# Patient Record
Sex: Female | Born: 1965 | Race: White | Hispanic: No | Smoking: Current every day smoker
Health system: Southern US, Community
[De-identification: ages and names within clinical notes are randomized; demographics above are authoritative.]

## PROBLEM LIST (undated history)

## (undated) DIAGNOSIS — M1990 Unspecified osteoarthritis, unspecified site: Secondary | ICD-10-CM

## (undated) DIAGNOSIS — I2510 Atherosclerotic heart disease of native coronary artery without angina pectoris: Secondary | ICD-10-CM

## (undated) DIAGNOSIS — R519 Headache, unspecified: Secondary | ICD-10-CM

## (undated) DIAGNOSIS — O00109 Unspecified tubal pregnancy without intrauterine pregnancy: Secondary | ICD-10-CM

## (undated) DIAGNOSIS — Z8669 Personal history of other diseases of the nervous system and sense organs: Secondary | ICD-10-CM

## (undated) DIAGNOSIS — G4733 Obstructive sleep apnea (adult) (pediatric): Secondary | ICD-10-CM

## (undated) DIAGNOSIS — C539 Malignant neoplasm of cervix uteri, unspecified: Secondary | ICD-10-CM

## (undated) DIAGNOSIS — J449 Chronic obstructive pulmonary disease, unspecified: Secondary | ICD-10-CM

## (undated) DIAGNOSIS — K219 Gastro-esophageal reflux disease without esophagitis: Secondary | ICD-10-CM

## (undated) HISTORY — DX: Malignant neoplasm of cervix uteri, unspecified: C53.9

## (undated) HISTORY — DX: Obstructive sleep apnea (adult) (pediatric): G47.33

## (undated) HISTORY — DX: Gastro-esophageal reflux disease without esophagitis: K21.9

## (undated) HISTORY — DX: Personal history of other diseases of the nervous system and sense organs: Z86.69

## (undated) HISTORY — DX: Unspecified tubal pregnancy without intrauterine pregnancy: O00.109

## (undated) HISTORY — DX: Obstructive sleep apnea (adult) (pediatric): J44.9

---

## 1985-07-08 HISTORY — PX: CHOLECYSTECTOMY: SHX55

## 1990-07-08 HISTORY — PX: TUBAL LIGATION: SHX77

## 2003-07-09 DIAGNOSIS — C539 Malignant neoplasm of cervix uteri, unspecified: Secondary | ICD-10-CM

## 2003-07-09 HISTORY — PX: ABDOMINAL HYSTERECTOMY: SHX81

## 2003-07-09 HISTORY — DX: Malignant neoplasm of cervix uteri, unspecified: C53.9

## 2003-07-09 HISTORY — PX: ACHILLES TENDON REPAIR: SUR1153

## 2003-10-18 ENCOUNTER — Ambulatory Visit (HOSPITAL_BASED_OUTPATIENT_CLINIC_OR_DEPARTMENT_OTHER): Admission: RE | Admit: 2003-10-18 | Discharge: 2003-10-18 | Payer: Self-pay | Admitting: Orthopaedic Surgery

## 2004-04-04 ENCOUNTER — Other Ambulatory Visit: Admission: RE | Admit: 2004-04-04 | Discharge: 2004-04-04 | Payer: Self-pay | Admitting: Obstetrics and Gynecology

## 2004-04-16 ENCOUNTER — Ambulatory Visit (HOSPITAL_COMMUNITY): Admission: RE | Admit: 2004-04-16 | Discharge: 2004-04-16 | Payer: Self-pay | Admitting: Obstetrics and Gynecology

## 2004-04-26 ENCOUNTER — Inpatient Hospital Stay (HOSPITAL_COMMUNITY): Admission: RE | Admit: 2004-04-26 | Discharge: 2004-04-28 | Payer: Self-pay | Admitting: Obstetrics and Gynecology

## 2005-04-08 ENCOUNTER — Other Ambulatory Visit: Admission: RE | Admit: 2005-04-08 | Discharge: 2005-04-08 | Payer: Self-pay | Admitting: Obstetrics and Gynecology

## 2011-10-03 ENCOUNTER — Ambulatory Visit: Payer: Commercial Managed Care - PPO

## 2013-09-22 ENCOUNTER — Ambulatory Visit (INDEPENDENT_AMBULATORY_CARE_PROVIDER_SITE_OTHER): Payer: BC Managed Care – PPO | Admitting: Internal Medicine

## 2013-09-22 ENCOUNTER — Telehealth: Payer: Self-pay | Admitting: Internal Medicine

## 2013-09-22 ENCOUNTER — Encounter: Payer: Self-pay | Admitting: Internal Medicine

## 2013-09-22 VITALS — BP 108/72 | HR 77 | Temp 98.3°F | Ht 68.25 in | Wt 248.5 lb

## 2013-09-22 DIAGNOSIS — N951 Menopausal and female climacteric states: Secondary | ICD-10-CM

## 2013-09-22 DIAGNOSIS — G479 Sleep disorder, unspecified: Secondary | ICD-10-CM

## 2013-09-22 DIAGNOSIS — K117 Disturbances of salivary secretion: Secondary | ICD-10-CM

## 2013-09-22 DIAGNOSIS — F17200 Nicotine dependence, unspecified, uncomplicated: Secondary | ICD-10-CM

## 2013-09-22 DIAGNOSIS — E669 Obesity, unspecified: Secondary | ICD-10-CM

## 2013-09-22 DIAGNOSIS — K219 Gastro-esophageal reflux disease without esophagitis: Secondary | ICD-10-CM

## 2013-09-22 DIAGNOSIS — R682 Dry mouth, unspecified: Secondary | ICD-10-CM

## 2013-09-22 DIAGNOSIS — G4733 Obstructive sleep apnea (adult) (pediatric): Secondary | ICD-10-CM | POA: Insufficient documentation

## 2013-09-22 NOTE — Assessment & Plan Note (Signed)
Increase your water intake Cut back on the smoking Use moisturizing mouth rinse such as biotene daily

## 2013-09-22 NOTE — Assessment & Plan Note (Signed)
Try to avoid any dairy after 7 pm Start taking your Nexium at night to see if this helps OK to take Tums if needed in addition

## 2013-09-22 NOTE — Assessment & Plan Note (Signed)
Will refer to pulm for evaluation for poss sleep study before starting sleep aid

## 2013-09-22 NOTE — Progress Notes (Signed)
HPI  Pt presents to the clinic today to establish care. She has not had a PCP in many years. She does have multiple concerns today.  1- She is having difficulty sleeping at night. She is concerned that she may have sleep apnea. She reports that she wakes herself up snoring and when she wakes up she often has trouble catching her breath. 2- Dry mouth. She is a smoker and does not drink any water 3- GERD: She is on nexium. She feels like her heartburn is acting up 2-3 times per week. She notices it more when she drinks a glass of mild before bed. 4: Hot flashes and night sweats. Started about a year and a half ago. Worse at night. She is on estroven which does help somewhat. 5. She is a current everyday smoker. She has some nicotine patches on hand, however she currently is not ready to quit. Her husband passed away 3 months ago from lung cancer. This has caused her a lot of grief.   Flu: never Tetanus:> 10 years ago LMP: Hysterectomy Pap Smear: 2006 Mammogram: 2005 Eye Doctor: never Dentist: as needed, last 2013    Past Medical History  Diagnosis Date  . Tubal pregnancy   . GERD (gastroesophageal reflux disease)   . Cervical cancer 2005  . Hx of migraines     Current Outpatient Prescriptions  Medication Sig Dispense Refill  . b complex vitamins capsule Take 1 capsule by mouth daily.      . calcium carbonate (TUMS - DOSED IN MG ELEMENTAL CALCIUM) 500 MG chewable tablet Chew 1 tablet by mouth daily.      Marland Kitchen esomeprazole (NEXIUM) 20 MG capsule Take 20 mg by mouth daily at 12 noon.      Marland Kitchen ibuprofen (ADVIL,MOTRIN) 200 MG tablet Take 600 mg by mouth as needed.      . Multiple Vitamins-Calcium (ONE-A-DAY WOMENS FORMULA PO) Take 1 capsule by mouth daily.      . Nutritional Supplements (ESTROVEN PO) Take 1 capsule by mouth daily.       No current facility-administered medications for this visit.    No Known Allergies  Family History  Problem Relation Age of Onset  . Heart disease  Mother   . Heart disease Father   . Hypertension Father     History   Social History  . Marital Status: Widowed    Spouse Name: N/A    Number of Children: N/A  . Years of Education: N/A   Occupational History  . Not on file.   Social History Main Topics  . Smoking status: Current Every Day Smoker -- 0.33 packs/day    Types: Cigarettes  . Smokeless tobacco: Never Used  . Alcohol Use: Yes     Comment: occasional  . Drug Use: No  . Sexual Activity: Not Currently   Other Topics Concern  . Not on file   Social History Narrative  . No narrative on file    ROS:  Constitutional: Denies fever, malaise, fatigue, headache or abrupt weight changes.  Respiratory: Denies difficulty breathing, shortness of breath, cough or sputum production.   Cardiovascular: Denies chest pain, chest tightness, palpitations or swelling in the hands or feet.    No other specific complaints in a complete review of systems (except as listed in HPI above).  PE:  BP 108/72  Pulse 77  Temp(Src) 98.3 F (36.8 C) (Oral)  Ht 5' 8.25" (1.734 m)  Wt 248 lb 8 oz (112.719 kg)  BMI 37.49  kg/m2  SpO2 98% Wt Readings from Last 3 Encounters:  09/22/13 248 lb 8 oz (112.719 kg)    General: Appears her stated age, obese but well developed, well nourished in NAD. Cardiovascular: Normal rate and rhythm. S1,S2 noted.  No murmur, rubs or gallops noted. No JVD or BLE edema. No carotid bruits noted. Pulmonary/Chest: Normal effort and positive vesicular breath sounds. No respiratory distress. No wheezes, rales or ronchi noted.   Assessment and Plan:

## 2013-09-22 NOTE — Assessment & Plan Note (Signed)
Fair control with estroven Continue at this time

## 2013-09-22 NOTE — Progress Notes (Signed)
HPI  Pt presents to the office today to establish care. She has not had a PCP in many years. She is a current everyday smoker. She has some nicotine patches on hand, however she currently is not ready to quit. Her husband passed away 3 months ago from lung cancer. This has caused her a lot of grief.   She does have some concerns today. She is having trouble sleeping. She states she does not feel rested, often has issues staying asleep. She does snore and was told by her late husband she snores loudly and did hold her breath at times. She denies difficulty breathing, syncopal episodes, or other sleep disturbances.  She also addresses concerns with her GERD. She takes Nexium 20mg  in the am. However at night she has heartburn that often keeps her awake. She does admit to eating and drinking close to bedtime. She denies nausea or vomiting.  Other concerns include hot flashes and night sweats. She does take estroven supplemental during the day. It has seemed to help, but at night the hot flashes and night sweats are worse. She has not tried anything else at night.   Pap smear: 2006; partial hysterectomy LMP: n/a Mammogram: 2005 Tetanus: >10 years Flu vaccine: n/a Dental Exam: 2013 Eye Exam: never; need to go  Past Medical History  Diagnosis Date  . Tubal pregnancy   . GERD (gastroesophageal reflux disease)   . Cervical cancer 2005  . Hx of migraines    No Known Allergies  Current outpatient prescriptions:b complex vitamins capsule, Take 1 capsule by mouth daily., Disp: , Rfl: ;  calcium carbonate (TUMS - DOSED IN MG ELEMENTAL CALCIUM) 500 MG chewable tablet, Chew 1 tablet by mouth daily., Disp: , Rfl: ;  esomeprazole (NEXIUM) 20 MG capsule, Take 20 mg by mouth daily at 12 noon., Disp: , Rfl: ;  ibuprofen (ADVIL,MOTRIN) 200 MG tablet, Take 600 mg by mouth as needed., Disp: , Rfl:  Multiple Vitamins-Calcium (ONE-A-DAY WOMENS FORMULA PO), Take 1 capsule by mouth daily., Disp: , Rfl: ;   Nutritional Supplements (ESTROVEN PO), Take 1 capsule by mouth daily., Disp: , Rfl:    No family history on file.  History   Social History  . Marital Status: Widowed    Spouse Name: N/A    Number of Children: N/A  . Years of Education: N/A   Occupational History  . Not on file.   Social History Main Topics  . Smoking status: Not on file  . Smokeless tobacco: Not on file  . Alcohol Use: Not on file  . Drug Use: Not on file  . Sexual Activity: Not on file   Other Topics Concern  . Not on file   Social History Narrative  . No narrative on file    ROS:  Constitutional: Denies fever, malaise, fatigue, headache or abrupt weight changes.  Respiratory: Denies difficulty breathing, shortness of breath, cough or sputum production.   Cardiovascular: Denies chest pain, chest tightness, palpitations or swelling in the hands or feet.  Gastrointestinal: Denies abdominal pain, bloating, constipation, diarrhea or blood in the stool.    No other specific complaints in a complete review of systems (except as listed in HPI above).  PE:  There were no vitals taken for this visit. Wt Readings from Last 3 Encounters:  No data found for Wt    General: Appears their stated age, well developed, well nourished in NAD.Marland Kitchen  Cardiovascular: Normal rate and rhythm. S1,S2 noted.  No murmur, rubs or gallops  noted. No JVD or BLE edema. No carotid bruits noted. Pulmonary/Chest: Normal effort and positive vesicular breath sounds. No respiratory distress. No wheezes, rales or ronchi noted.  Abdomen: Soft and nontender. Normal bowel sounds, no bruits noted. No distention or masses noted. Liver, spleen and kidneys non palpable. Psychiatric: Mood and affect normal. Behavior is normal. Judgment and thought content normal.   EKG:  BMET No results found for this basename: na, k, cl, co2, glucose, bun, creatinine, calcium, gfrnonaa, gfraa    Lipid Panel  No results found for this basename: chol, trig,  hdl, cholhdl, vldl, ldlcalc    CBC No results found for this basename: wbc, rbc, hgb, hct, plt, mcv, mch, mchc, rdw, neutrabs, lymphsabs, monoabs, eosabs, basosabs    Hgb A1C No results found for this basename: HGBA1C     Assessment and Plan: Health Maintenance: Tdap to be given at follow up Will refer for mammogram Will refer for sleep study for possible sleep apnea; pt seems very concerned with this. Obtained cmet, cbc, and lipid panel today  Sleep disturbances: Will obtain a sleep study and go from here; do not feel comfortable with sleep agent at this point  GERD: Can try to decrease eating close to bedtimes; limit caffiene and irritating foods Try taking Nexium in the evening to see if this improves Will readdress at later visit  Hot flashes/night sweats: Try to taking estroven at night to see if helps Will readdress again if no improvement  Follow up in one month for physical  Eura Radabaugh, Demetrius Charity, Student-NP

## 2013-09-22 NOTE — Patient Instructions (Addendum)
Menopause Menopause is the normal time of life when menstrual periods stop completely. Menopause is complete when you have missed 12 consecutive menstrual periods. It usually occurs between the ages of 8 years and 48 years. Very rarely does a woman develop menopause before the age of 60 years. At menopause, your ovaries stop producing the female hormones estrogen and progesterone. This can cause undesirable symptoms and also affect your health. Sometimes the symptoms may occur 4 5 years before the menopause begins. There is no relationship between menopause and:  Oral contraceptives.  Number of children you had.  Race.  The age your menstrual periods started (menarche). Heavy smokers and very thin women may develop menopause earlier in life. CAUSES  The ovaries stop producing the female hormones estrogen and progesterone.  Other causes include:  Surgery to remove both ovaries.  The ovaries stop functioning for no known reason.  Tumors of the pituitary gland in the brain.  Medical disease that affects the ovaries and hormone production.  Radiation treatment to the abdomen or pelvis.  Chemotherapy that affects the ovaries. SYMPTOMS   Hot flashes.  Night sweats.  Decrease in sex drive.  Vaginal dryness and thinning of the vagina causing painful intercourse.  Dryness of the skin and developing wrinkles.  Headaches.  Tiredness.  Irritability.  Memory problems.  Weight gain.  Bladder infections.  Hair growth of the face and chest.  Infertility. More serious symptoms include:  Loss of bone (osteoporosis) causing breaks (fractures).  Depression.  Hardening and narrowing of the arteries (atherosclerosis) causing heart attacks and strokes. DIAGNOSIS   When the menstrual periods have stopped for 12 straight months.  Physical exam.  Hormone studies of the blood. TREATMENT  There are many treatment choices and nearly as many questions about them. The  decisions to treat or not to treat menopausal changes is an individual choice made with your health care provider. Your health care provider can discuss the treatments with you. Together, you can decide which treatment will work best for you. Your treatment choices may include:   Hormone therapy (estrogen and progesterone).  Non-hormonal medicines.  Treating the individual symptoms with medicine (for example antidepressants for depression).  Herbal medicines that may help specific symptoms.  Counseling by a psychiatrist or psychologist.  Group therapy.  Lifestyle changes including:  Eating healthy.  Regular exercise.  Limiting caffeine and alcohol.  Stress management and meditation.  No treatment. HOME CARE INSTRUCTIONS   Take the medicine your health care provider gives you as directed.  Get plenty of sleep and rest.  Exercise regularly.  Eat a diet that contains calcium (good for the bones) and soy products (acts like estrogen hormone).  Avoid alcoholic beverages.  Do not smoke.  If you have hot flashes, dress in layers.  Take supplements, calcium, and vitamin D to strengthen bones.  You can use over-the-counter lubricants or moisturizers for vaginal dryness.  Group therapy is sometimes very helpful.  Acupuncture may be helpful in some cases. SEEK MEDICAL CARE IF:   You are not sure you are in menopause.  You are having menopausal symptoms and need advice and treatment.  You are still having menstrual periods after age 71 years.  You have pain with intercourse.  Menopause is complete (no menstrual period for 12 months) and you develop vaginal bleeding.  You need a referral to a specialist (gynecologist, psychiatrist, or psychologist) for treatment. SEEK IMMEDIATE MEDICAL CARE IF:   You have severe depression.  You have excessive vaginal bleeding.  You fell and think you have a broken bone.  You have pain when you urinate.  You develop leg or  chest pain.  You have a fast pounding heart beat (palpitations).  You have severe headaches.  You develop vision problems.  You feel a lump in your breast.  You have abdominal pain or severe indigestion. Document Released: 09/14/2003 Document Revised: 02/24/2013 Document Reviewed: 01/21/2013 [facility] Patient Information 2014 [place], [place].

## 2013-09-22 NOTE — Telephone Encounter (Signed)
Relevant patient education assigned to patient using Emmi. ° °

## 2013-09-22 NOTE — Progress Notes (Signed)
Pre visit review using our clinic review tool, if applicable. No additional management support is needed unless otherwise documented below in the visit note. 

## 2013-09-22 NOTE — Assessment & Plan Note (Signed)
Encouraged her to work on diet and exercise 

## 2013-09-22 NOTE — Assessment & Plan Note (Signed)
Unmotivated to quit at this time

## 2013-09-23 LAB — LIPID PANEL
CHOLESTEROL: 190 mg/dL (ref 0–200)
HDL: 53 mg/dL (ref >39.00)
LDL Cholesterol: 116 mg/dL — ABNORMAL HIGH (ref 0–99)
Total CHOL/HDL Ratio: 4
Triglycerides: 105 mg/dL (ref 0.0–149.0)
VLDL: 21 mg/dL (ref 0.0–40.0)

## 2013-09-23 LAB — COMPREHENSIVE METABOLIC PANEL
ALBUMIN: 4.1 g/dL (ref 3.5–5.2)
ALT: 22 U/L (ref 0–35)
AST: 19 U/L (ref 0–37)
Alkaline Phosphatase: 58 U/L (ref 39–117)
BILIRUBIN TOTAL: 0.3 mg/dL (ref 0.3–1.2)
BUN: 15 mg/dL (ref 6–23)
CO2: 27 meq/L (ref 19–32)
Calcium: 9 mg/dL (ref 8.4–10.5)
Chloride: 105 mEq/L (ref 96–112)
Creatinine, Ser: 0.8 mg/dL (ref 0.4–1.2)
GFR: 80.95 mL/min (ref >60.00)
GLUCOSE: 91 mg/dL (ref 70–99)
Potassium: 4 mEq/L (ref 3.5–5.1)
SODIUM: 141 meq/L (ref 135–145)
Total Protein: 6.7 g/dL (ref 6.0–8.3)

## 2013-09-23 LAB — TSH: TSH: 0.93 u[IU]/mL (ref 0.35–5.50)

## 2013-09-23 LAB — HEMOGLOBIN A1C: HEMOGLOBIN A1C: 6.1 % (ref 4.6–6.5)

## 2013-09-23 LAB — CBC
HCT: 40.9 % (ref 36.0–46.0)
Hemoglobin: 13.5 g/dL (ref 12.0–15.0)
MCHC: 33 g/dL (ref 30.0–36.0)
MCV: 93.2 fl (ref 78.0–100.0)
Platelets: 212 10*3/uL (ref 150.0–400.0)
RBC: 4.39 Mil/uL (ref 3.87–5.11)
RDW: 14 % (ref 11.5–14.6)
WBC: 9.6 10*3/uL (ref 4.5–10.5)

## 2013-10-07 ENCOUNTER — Encounter: Payer: Self-pay | Admitting: Internal Medicine

## 2013-10-07 ENCOUNTER — Ambulatory Visit: Payer: BC Managed Care – PPO | Admitting: Internal Medicine

## 2013-10-07 ENCOUNTER — Ambulatory Visit (INDEPENDENT_AMBULATORY_CARE_PROVIDER_SITE_OTHER): Payer: BC Managed Care – PPO | Admitting: Internal Medicine

## 2013-10-07 VITALS — BP 114/72 | HR 72 | Temp 97.8°F | Ht 68.25 in | Wt 248.0 lb

## 2013-10-07 DIAGNOSIS — Z1239 Encounter for other screening for malignant neoplasm of breast: Secondary | ICD-10-CM

## 2013-10-07 DIAGNOSIS — Z23 Encounter for immunization: Secondary | ICD-10-CM

## 2013-10-07 DIAGNOSIS — Z0000 Encounter for general adult medical examination without abnormal findings: Secondary | ICD-10-CM

## 2013-10-07 NOTE — Patient Instructions (Addendum)

## 2013-10-07 NOTE — Progress Notes (Signed)
Subjective:    Patient ID: Kathryn Mason, female    DOB: 1966-02-29, 48 y.o.   MRN: [id number]  HPI  Pt presents to the clinic today for her annual exam. She has no concerns today.  Pap smear: 2006; partial hysterectomy LMP: n/a Mammogram: 2005 Tetanus: >10 years Flu vaccine: n/a Dental Exam: 2013 Eye Exam: never; need to go  Review of Systems      Past Medical History  Diagnosis Date  . Tubal pregnancy   . GERD (gastroesophageal reflux disease)   . Cervical cancer 2005  . Hx of migraines     Current Outpatient Prescriptions  Medication Sig Dispense Refill  . b complex vitamins capsule Take 1 capsule by mouth daily.      . Black Cohosh 40 MG CAPS Take 2 capsules by mouth at bedtime and may repeat dose one time if needed.      . calcium carbonate (TUMS - DOSED IN MG ELEMENTAL CALCIUM) 500 MG chewable tablet Chew 1 tablet by mouth daily.      Marland Kitchen esomeprazole (NEXIUM) 20 MG capsule Take 20 mg by mouth daily at 12 noon.      Marland Kitchen ibuprofen (ADVIL,MOTRIN) 200 MG tablet Take 600 mg by mouth as needed.      . Melatonin 5 MG CAPS Take 1 capsule by mouth at bedtime and may repeat dose one time if needed.      . Multiple Vitamins-Calcium (ONE-A-DAY WOMENS FORMULA PO) Take 1 capsule by mouth daily.      . Nutritional Supplements (ESTROVEN PO) Take 1 capsule by mouth daily.       No current facility-administered medications for this visit.    No Known Allergies  Family History  Problem Relation Age of Onset  . Heart disease Mother   . Heart disease Father   . Hypertension Father     History   Social History  . Marital Status: Widowed    Spouse Name: N/A    Number of Children: N/A  . Years of Education: N/A   Occupational History  . Not on file.   Social History Main Topics  . Smoking status: Current Every Day Smoker -- 0.33 packs/day    Types: Cigarettes  . Smokeless tobacco: Never Used  . Alcohol Use: Yes     Comment: occasional  . Drug Use: No  . Sexual  Activity: Not Currently   Other Topics Concern  . Not on file   Social History Narrative  . No narrative on file     Constitutional: Denies fever, malaise, fatigue, headache or abrupt weight changes.  HEENT: Denies eye pain, eye redness, ear pain, ringing in the ears, wax buildup, runny nose, nasal congestion, bloody nose, or sore throat. Respiratory: Denies difficulty breathing, shortness of breath, cough or sputum production.   Cardiovascular: Denies chest pain, chest tightness, palpitations or swelling in the hands or feet.  Gastrointestinal: Denies abdominal pain, bloating, constipation, diarrhea or blood in the stool.  GU: Pt reports some stress incontinence.Denies urgency, frequency, pain with urination, burning sensation, blood in urine, odor or discharge. Musculoskeletal: Denies decrease in range of motion, difficulty with gait, muscle pain or joint pain and swelling.  Skin: Denies redness, rashes, lesions or ulcercations.  Neurological: Denies dizziness, difficulty with memory, difficulty with speech or problems with balance and coordination.   No other specific complaints in a complete review of systems (except as listed in HPI above).  Objective:   Physical Exam   BP 114/72  Pulse 72  Temp(Src) 97.8 F (36.6 C) (Oral)  Ht 5' 8.25" (1.734 m)  Wt 248 lb (112.492 kg)  BMI 37.41 kg/m2  SpO2 98% Wt Readings from Last 3 Encounters:  10/07/13 248 lb (112.492 kg)  09/22/13 248 lb 8 oz (112.719 kg)    Constitutional:  Alert, oriented x 4, well developed, well nourished in no apparent distress. Skin: Skin is warm and dry.  No erythema, lesion or ulceration noted. HEENT: Head: normal shape and size; Eyes: sclera white, no icterus, conjunctiva pink, PERRLA and EOMs intact; Ears: Tm's gray and intact, normal light reflex; Nose: mucosa pink and moist, septum midline; Throat/Mouth: Teeth present, , mucosa pink and moist, no lesions or ulcerations noted. Neck: Normal range of  motion. Neck supple, trachea midline. No massses, lumps or thyromegaly present.  Cardiovascular: Normal rate and rhythm. S1,S2 noted.  No murmur, rubs or gallops noted. No JVD or BLE edema. No carotid bruits noted. Pulmonary/Chest: Normal effort and positive vesicular breath sounds. No respiratory distress. No wheezes, rales or ronchi noted.  Abdomen: Soft and nontender. Normal bowel sounds, no bruits noted. No distention or masses noted. Liver, spleen and kidneys non palpable. Genitourinary: Normal female anatomy.  Adenexa non palpable. Breast without lumps or masses.  Musculoskeletal: Normal range of motion. Patient exhibits no effusions.  Neurological: Alert and oriented. Cranial nerves II-XII intact. Coordination normal. +DTRs bilaterally. Psychiatric: She has a normal mood and affect. Behavior is normal. Judgment and thought content normal.    BMET    Component Value Date/Time   NA 141 09/22/2013 1613   K 4.0 09/22/2013 1613   CL 105 09/22/2013 1613   CO2 27 09/22/2013 1613   GLUCOSE 91 09/22/2013 1613   BUN 15 09/22/2013 1613   CREATININE 0.8 09/22/2013 1613   CALCIUM 9.0 09/22/2013 1613    Lipid Panel     Component Value Date/Time   CHOL 190 09/22/2013 1613   TRIG 105.0 09/22/2013 1613   HDL 53.00 09/22/2013 1613   CHOLHDL 4 09/22/2013 1613   VLDL 21.0 09/22/2013 1613   LDLCALC 116* 09/22/2013 1613    CBC    Component Value Date/Time   WBC 9.6 09/22/2013 1613   RBC 4.39 09/22/2013 1613   HGB 13.5 09/22/2013 1613   HCT 40.9 09/22/2013 1613   PLT 212.0 09/22/2013 1613   MCV 93.2 09/22/2013 1613   MCHC 33.0 09/22/2013 1613   RDW 14.0 09/22/2013 1613    Hgb A1C Lab Results  Component Value Date   HGBA1C 6.1 09/22/2013        Assessment & Plan:   Preventative Health Maintenance:  Breast exam today Pelvic exam today Smoking Cessation discussed, she is not ready to quit Mammogram ordered Encouraged her to visit an eye doctor and dentist Tdap today Lab work reviewed  RTC in  1 year or sooner if needed

## 2013-10-11 NOTE — Addendum Note (Signed)
Addended by: Lurlean Nanny on: 10/11/2013 10:08 AM   Modules accepted: Orders

## 2013-11-01 ENCOUNTER — Encounter: Payer: Self-pay | Admitting: Pulmonary Disease

## 2013-11-01 ENCOUNTER — Ambulatory Visit
Admission: RE | Admit: 2013-11-01 | Discharge: 2013-11-01 | Disposition: A | Discharge status: Home / Self Care | Payer: Self-pay | Source: Ambulatory Visit | Attending: Internal Medicine | Admitting: Internal Medicine

## 2013-11-01 ENCOUNTER — Ambulatory Visit (INDEPENDENT_AMBULATORY_CARE_PROVIDER_SITE_OTHER): Payer: BC Managed Care – PPO | Admitting: Pulmonary Disease

## 2013-11-01 VITALS — BP 138/84 | HR 71 | Temp 97.3°F | Ht 69.0 in | Wt 250.0 lb

## 2013-11-01 DIAGNOSIS — Z1239 Encounter for other screening for malignant neoplasm of breast: Secondary | ICD-10-CM

## 2013-11-01 DIAGNOSIS — G4733 Obstructive sleep apnea (adult) (pediatric): Secondary | ICD-10-CM

## 2013-11-01 DIAGNOSIS — Z0000 Encounter for general adult medical examination without abnormal findings: Secondary | ICD-10-CM

## 2013-11-01 NOTE — Progress Notes (Signed)
   Subjective:    Patient ID: Kathryn Mason, female    DOB: [birth year], 48 y.o.   MRN: [id number]  HPI The patient is a 48 year old female who I've been asked to see for possible obstructive sleep apnea. The patient states that she has been told she has loud snoring, as well as an abnormal breathing pattern during sleep. She has frequent awakenings at night, and does not feel rested in the mornings upon arising. She notes significant sleep pressure during the day with inactivity, and will fall sleep in the evenings watching television. She also notes sleep pressure driving at times. Her weight is neutral over the last 2 years, and her Epworth score today is 20.   Sleep Questionnaire What time do you typically go to bed?( Between what hours) 10:00-11:00 10:00-11:00 at 1000 on 11/01/13 by Len Blalock, CMA How long does it take you to fall asleep? 10-30 minutes 10-30 minutes at 1000 on 11/01/13 by Len Blalock, CMA How many times during the night do you wake up? 4 4 at 1000 on 11/01/13 by Len Blalock, CMA What time do you get out of bed to start your day? 0630 0630 at 1000 on 11/01/13 by Len Blalock, CMA Do you drive or operate heavy machinery in your occupation? No No at 1000 on 11/01/13 by Len Blalock, CMA How much has your weight changed (up or down) over the past two years? (In pounds) 0 oz (0 kg) 0 oz (0 kg) at 1000 on 11/01/13 by Len Blalock, CMA Have you ever had a sleep study before? No No at 1000 on 11/01/13 by Len Blalock, CMA Do you currently use CPAP? No No at 1000 on 11/01/13 by Len Blalock, CMA Do you wear oxygen at any time? No No at 1000 on 11/01/13 by Len Blalock, CMA   Review of Systems  Constitutional: Negative for fever and unexpected weight change.  HENT: Positive for sore throat. Negative for congestion, dental problem, ear pain, nosebleeds, postnasal drip, rhinorrhea, sinus pressure, sneezing  and trouble swallowing.   Eyes: Negative for redness and itching.  Respiratory: Negative for cough, chest tightness, shortness of breath and wheezing.   Cardiovascular: Negative for palpitations and leg swelling.  Gastrointestinal: Negative for nausea and vomiting.  Genitourinary: Negative for dysuria.  Musculoskeletal: Negative for joint swelling.  Skin: Negative for rash.  Neurological: Negative for headaches.  Hematological: Does not bruise/bleed easily.  Psychiatric/Behavioral: Negative for dysphoric mood. The patient is not nervous/anxious.        Objective:   Physical Exam Constitutional:  Overweight female, no acute distress  HENT:  Nares patent without discharge, but large turbinates and narrowing on the left  Oropharynx without exudate, palate and uvula are normal  Eyes:  Perrla, eomi, no scleral icterus  Neck:  No JVD, no TMG  Cardiovascular:  Normal rate, regular rhythm, no rubs or gallops.  No murmurs        Intact distal pulses  Pulmonary :  Normal breath sounds, no stridor or respiratory distress   No rales, rhonchi, or wheezing  Abdominal:  Soft, nondistended, bowel sounds present.  No tenderness noted.   Musculoskeletal:  Minimal lower extremity edema noted.  Lymph Nodes:  No cervical lymphadenopathy noted  Skin:  No cyanosis noted  Neurologic:  Alert, appropriate, moves all 4 extremities without obvious deficit.         Assessment & Plan:

## 2013-11-01 NOTE — Assessment & Plan Note (Signed)
The patient's history is very suggestive of clinically significant obstructive sleep apnea. I have had a long discussion with her about sleep apnea, including its impact to her quality of life and cardiovascular health. She will need a sleep study for diagnosis, and I think she is an excellent candidate for home sleep testing. We'll arrange followup once the results are available. Finally, I have encouraged her to work aggressively on weight loss.

## 2013-11-01 NOTE — Patient Instructions (Signed)
Will schedule for home sleep testing to evaluate you for sleep apnea. We'll then arrange for followup once the results are available.

## 2013-11-02 ENCOUNTER — Other Ambulatory Visit: Payer: Self-pay | Admitting: Internal Medicine

## 2013-11-02 DIAGNOSIS — R928 Other abnormal and inconclusive findings on diagnostic imaging of breast: Secondary | ICD-10-CM

## 2013-11-12 ENCOUNTER — Ambulatory Visit
Admission: RE | Admit: 2013-11-12 | Discharge: 2013-11-12 | Disposition: A | Discharge status: Home / Self Care | Payer: BC Managed Care – PPO | Source: Ambulatory Visit | Attending: Internal Medicine | Admitting: Internal Medicine

## 2013-11-12 DIAGNOSIS — R928 Other abnormal and inconclusive findings on diagnostic imaging of breast: Secondary | ICD-10-CM

## 2013-12-07 DIAGNOSIS — G4733 Obstructive sleep apnea (adult) (pediatric): Secondary | ICD-10-CM

## 2013-12-09 ENCOUNTER — Encounter: Payer: Self-pay | Admitting: Pulmonary Disease

## 2013-12-09 ENCOUNTER — Telehealth: Payer: Self-pay | Admitting: Pulmonary Disease

## 2013-12-09 ENCOUNTER — Ambulatory Visit (INDEPENDENT_AMBULATORY_CARE_PROVIDER_SITE_OTHER): Payer: BC Managed Care – PPO | Admitting: Pulmonary Disease

## 2013-12-09 VITALS — BP 112/72 | HR 75 | Ht 69.0 in | Wt 248.0 lb

## 2013-12-09 DIAGNOSIS — G4733 Obstructive sleep apnea (adult) (pediatric): Secondary | ICD-10-CM

## 2013-12-09 NOTE — Telephone Encounter (Signed)
Made pt an appt to go over sleep study results Nothing further needed at this time.

## 2013-12-09 NOTE — Telephone Encounter (Signed)
Pt needs ov to review sleep study 

## 2013-12-09 NOTE — Patient Instructions (Signed)
Will start on cpap with moderate pressure.  Please call if having issues with tolerance. Work on weight loss followup with me again in 8 weeks.

## 2013-12-09 NOTE — Telephone Encounter (Signed)
[email or web address].Shylie Polo, CMA  

## 2013-12-09 NOTE — Assessment & Plan Note (Signed)
The patient has severe obstructive sleep apnea by her recent home sleep testing, and I have reviewed the results with her in detail. She will greatly benefit from a trial of CPAP while working on weight loss, and she is agreeable to this approach. I will set the patient up on cpap at a moderate pressure level to allow for desensitization, and will troubleshoot the device over the next 4-6weeks if needed.  The pt is to call me if having issues with tolerance.  Will then optimize the pressure once patient is able to wear cpap on a consistent basis.

## 2013-12-09 NOTE — Progress Notes (Signed)
   Subjective:    Patient ID: Kathryn Mason, female    DOB: 02/08/1966, 48 y.o.   MRN: [id number]  HPI Patient comes in today for followup of her recent home sleep testing. She was found to have severe OSA, with an AHI of 49 events per hour. I have reviewed the study with her in detail, and answered all of her questions.   Review of Systems  Constitutional: Negative for fever and unexpected weight change.  HENT: Negative for congestion, dental problem, ear pain, nosebleeds, postnasal drip, rhinorrhea, sinus pressure, sneezing, sore throat and trouble swallowing.   Eyes: Negative for redness and itching.  Respiratory: Negative for cough, chest tightness, shortness of breath and wheezing.   Cardiovascular: Negative for palpitations and leg swelling.  Gastrointestinal: Negative for nausea and vomiting.  Genitourinary: Negative for dysuria.  Musculoskeletal: Negative for joint swelling.  Skin: Negative for rash.  Neurological: Negative for headaches.  Hematological: Does not bruise/bleed easily.  Psychiatric/Behavioral: Negative for dysphoric mood. The patient is not nervous/anxious.        Objective:   Physical Exam Obese female in no acute distress Nose without purulence or discharge noted Neck without lymphadenopathy or thyromegaly Lower extremities with mild edema, no cyanosis Alert and oriented, moves all 4 extremities.       Assessment & Plan:

## 2014-01-18 ENCOUNTER — Telehealth: Payer: Self-pay | Admitting: Pulmonary Disease

## 2014-01-18 ENCOUNTER — Telehealth: Payer: Self-pay | Admitting: *Deleted

## 2014-01-18 NOTE — Telephone Encounter (Signed)
error 

## 2014-01-18 NOTE — Telephone Encounter (Signed)
I called the pt to reschedule her appt on 02-07-14. [id number]. [place] Bing, CMA

## 2014-01-24 NOTE — Telephone Encounter (Signed)
Pt rescheduled to 4:15 on same day. [place] Bing, CMA

## 2014-02-02 ENCOUNTER — Encounter: Payer: Self-pay | Admitting: Family Medicine

## 2014-02-02 ENCOUNTER — Ambulatory Visit (INDEPENDENT_AMBULATORY_CARE_PROVIDER_SITE_OTHER): Payer: BC Managed Care – PPO | Admitting: Family Medicine

## 2014-02-02 VITALS — BP 104/64 | HR 86 | Temp 98.3°F | Wt 253.2 lb

## 2014-02-02 DIAGNOSIS — M7710 Lateral epicondylitis, unspecified elbow: Secondary | ICD-10-CM | POA: Insufficient documentation

## 2014-02-02 DIAGNOSIS — M7711 Lateral epicondylitis, right elbow: Secondary | ICD-10-CM

## 2014-02-02 NOTE — Patient Instructions (Signed)
Tennis elbow.  Get a tennis elbow strap and use that.  Ice the area and take OTC ibuprofen with food (up to 600mg  up to 3 times a day). Use the exercises and this should get better.  Take care.

## 2014-02-02 NOTE — Progress Notes (Signed)
Pre visit review using our clinic review tool, if applicable. No additional management support is needed unless otherwise documented below in the visit note.  R elbow pain for about 2-3 weeks.  No with mor pain.  A lot of computer work, mouse worse.  At/near the lateral epicondyle.  No medial pain.  No trauma.  No ROm deficit.  Pain with opening a bottle, reaching.  Elbow isn't puffy or red.  She tried ibuprofen intermittently, tried ice and heat.    Meds, vitals, and allergies reviewed.   ROS: See HPI.  Otherwise, noncontributory.  nad ncat Normal PROM at the R shoulder elbow and wrist  Normal inspection, sensation, pulses on R arm. R lateral epicondyle ttp, pain on testing for resisted supination and pronation, improved with compression of the proximal forearm.

## 2014-02-02 NOTE — Assessment & Plan Note (Signed)
D/w pt.  Typical exam.  Anatomy and pathology d/w pt.  Use a strap, ice and nsaids.  nsaid caution. Use handout re: exercises and call back prn.  She agrees.

## 2014-02-03 ENCOUNTER — Ambulatory Visit: Payer: BC Managed Care – PPO | Admitting: Pulmonary Disease

## 2014-02-07 ENCOUNTER — Encounter: Payer: Self-pay | Admitting: Pulmonary Disease

## 2014-02-07 ENCOUNTER — Ambulatory Visit: Payer: BC Managed Care – PPO | Admitting: Pulmonary Disease

## 2014-02-07 ENCOUNTER — Ambulatory Visit (INDEPENDENT_AMBULATORY_CARE_PROVIDER_SITE_OTHER): Payer: BC Managed Care – PPO | Admitting: Pulmonary Disease

## 2014-02-07 VITALS — BP 130/84 | HR 84 | Temp 98.2°F | Ht 69.0 in | Wt 250.4 lb

## 2014-02-07 DIAGNOSIS — G4733 Obstructive sleep apnea (adult) (pediatric): Secondary | ICD-10-CM

## 2014-02-07 NOTE — Assessment & Plan Note (Signed)
The patient is doing very well on CPAP, but her downloaded shows that she needs a higher pressure. Will change the upper limit of her auto set device, and this should better control her sleep apnea. I've also encouraged her to keep up with her mask changes and supplies, and to work aggressively on weight loss.

## 2014-02-07 NOTE — Patient Instructions (Signed)
Will have your pressure changed to 5-20cm.  This should completely treat your sleep apnea. Work on weight loss followup with me again in 49mos

## 2014-02-07 NOTE — Progress Notes (Signed)
   Subjective:    Patient ID: Kathryn Mason, female    DOB: 05-Mar-1966, 48 y.o.   MRN: [id number]  HPI The patient comes in today for followup of her obstructive sleep apnea. She is wearing CPAP compliantly, and is having no issues with her mask fit or pressure. She feels that her sleep is definitely improved, and now she does not get sleepy during the day until the afternoon. Her download shows persistent breakthrough at 10 events per hour, but she does have excellent compliance and no significant mask leak.   Review of Systems  Constitutional: Negative for fever and unexpected weight change.  HENT: Negative for congestion, dental problem, ear pain, nosebleeds, postnasal drip, rhinorrhea, sinus pressure, sneezing, sore throat and trouble swallowing.   Eyes: Negative for redness and itching.  Respiratory: Negative for cough, chest tightness, shortness of breath and wheezing.   Cardiovascular: Negative for palpitations and leg swelling.  Gastrointestinal: Negative for nausea and vomiting.  Genitourinary: Negative for dysuria.  Musculoskeletal: Negative for joint swelling.  Skin: Negative for rash.  Neurological: Negative for headaches.  Hematological: Does not bruise/bleed easily.  Psychiatric/Behavioral: Negative for dysphoric mood. The patient is not nervous/anxious.        Objective:   Physical Exam Overweight female in no acute distress Nose without purulence or discharge noted Neck without lymphadenopathy or thyromegaly No skin breakdown or pressure necrosis from the CPAP mask Lower extremities with minimal edema, no cyanosis Alert, does not appear to be sleepy, moves all 4 extremities.       Assessment & Plan:

## 2014-07-12 ENCOUNTER — Telehealth: Payer: Commercial Managed Care - PPO | Admitting: Nurse Practitioner

## 2014-07-12 DIAGNOSIS — J0100 Acute maxillary sinusitis, unspecified: Secondary | ICD-10-CM

## 2014-07-12 MED ORDER — AMOXICILLIN 875 MG PO TABS: 875.0000 mg | ORAL_TABLET | Freq: Two times a day (BID) | ORAL | Status: DC

## 2014-07-12 NOTE — Progress Notes (Signed)
We are sorry that you are not feeling well.  Here is how we plan to help!  Based on what you have shared with me it looks like you have sinusitis.  Sinusitis is inflammation and infection in the sinus cavities of the head.  Based on your presentation I believe you most likely have Acute Bacterial sinusitis.  This is an infection caused by bacteria and is treated with antibiotics.  I have prescribed Amoxicillin, an antibiotic in the penicillin family, one tablet twice daily with food, for 7 days.  You may use an oral decongestant such as Mucinex D or if you have glaucoma or high blood pressure use plain Mucinex.  Saline nasal sprays help an can sefely be used as often as needed for congestion.  If you develop worsening sinus pain, fever or notice severe headache and vision changes, or if symptoms are not better after completion of antibiotic, please schedule an appointment with a health care provider.  Sinus infections are not as easily transmitted as other respiratory infection, however we still recommend that you avoid close contact with loved ones, especially the very young and elderly.  Remember to wash your hands thoroughly throughout the day as this is the number one way to prevent the spread of infection!  Home Care:  Only take medications as instructed by your medical team.  Complete the entire course of an antibiotic.  Do not take these medications with alcohol.  A steam or ultrasonic humidifier can help congestion.  You can place a towel over your head and breathe in the steam from hot water coming from a faucet.  Avoid close contacts especially the very young and the elderly.  Cover your mouth when you cough or sneeze.  Always remember to wash your hands.  Get Help Right Away If:  You develop worsening fever or sinus pain.  You develop a severe head ache or visual changes.  Your symptoms persist after you have completed your treatment plan.  Make sure you  Understand these  instructions.  Will watch your condition.  Will get help right away if you are not doing well or get worse.  Your e-visit answers were reviewed by a board certified advanced clinical practitioner to complete your personal care plan.  Depending on the condition, your plan could have included both over the counter or prescription medications.  If there is a problem please reply  once you have received a response from your provider.  Your safety is important to [place].  If you have drug allergies check your prescription carefully.    You can use MyChart to ask questions about today's visit, request a non-urgent call back, or ask for a work or school excuse.  You will get an e-mail in the next two days asking about your experience.  I hope that your e-visit has been valuable and will speed your recovery. Thank you for using e-visits.

## 2014-08-11 ENCOUNTER — Ambulatory Visit (INDEPENDENT_AMBULATORY_CARE_PROVIDER_SITE_OTHER): Payer: BLUE CROSS/BLUE SHIELD | Admitting: Pulmonary Disease

## 2014-08-11 ENCOUNTER — Encounter: Payer: Self-pay | Admitting: Pulmonary Disease

## 2014-08-11 VITALS — BP 140/84 | HR 60 | Temp 97.0°F | Ht 69.0 in | Wt 261.8 lb

## 2014-08-11 DIAGNOSIS — G4733 Obstructive sleep apnea (adult) (pediatric): Secondary | ICD-10-CM

## 2014-08-11 NOTE — Assessment & Plan Note (Signed)
The pt is doing very well with cpap, and feels it has helped her sleep and daytime alertness.  She is having humidity issues, and I have asked her to get with her homecare company to help with adjustments.  Finally, I have asked her to work aggressively on weight loss.

## 2014-08-11 NOTE — Progress Notes (Signed)
   Subjective:    Patient ID: Kathryn Mason, female    DOB: [birth year], 49 y.o.   MRN: [id number]  HPI The patient comes in today for follow-up of her obstructive sleep apnea. She is wearing C Pap compliantly by her download, and has excellent control of her AHI. No significant mask leak on her download, but she feels that fit is not quite right. She is also having issues with her humidity, and can get very dry at times. She is not sure how to work the heated humidifier system. She feels that she sleeps fairly well with the device, and denies any significant daytime alertness issues. Of note, her weight is increased since the last visit.   Review of Systems  Constitutional: Negative for fever and unexpected weight change.  HENT: Negative for congestion, dental problem, ear pain, nosebleeds, postnasal drip, rhinorrhea, sinus pressure, sneezing, sore throat and trouble swallowing.   Eyes: Negative for redness and itching.  Respiratory: Negative for cough, chest tightness, shortness of breath and wheezing.   Cardiovascular: Negative for palpitations and leg swelling.  Gastrointestinal: Negative for nausea and vomiting.  Genitourinary: Negative for dysuria.  Musculoskeletal: Negative for joint swelling.  Skin: Negative for rash.  Neurological: Negative for headaches.  Hematological: Does not bruise/bleed easily.  Psychiatric/Behavioral: Negative for dysphoric mood. The patient is not nervous/anxious.        Objective:   Physical Exam Obese female in no acute distress Nose without purulence or discharge noted No skin breakdown or pressure necrosis from the C Pap mask Neck without lymphadenopathy or thyromegaly Lower extremities with mild edema, no cyanosis Alert and oriented, does not appear to be sleepy, moves all 4 extremities.       Assessment & Plan:

## 2014-08-11 NOTE — Patient Instructions (Signed)
Continue on cpap, and keep working with mask fit Check with your home care company on how to adjust the heated humidifier. Work on weight loss followup with me again in one year if doing well.

## 2014-11-11 ENCOUNTER — Other Ambulatory Visit: Payer: Self-pay | Admitting: Internal Medicine

## 2014-11-11 DIAGNOSIS — Z0000 Encounter for general adult medical examination without abnormal findings: Secondary | ICD-10-CM

## 2014-11-16 ENCOUNTER — Other Ambulatory Visit (INDEPENDENT_AMBULATORY_CARE_PROVIDER_SITE_OTHER): Payer: BLUE CROSS/BLUE SHIELD

## 2014-11-16 DIAGNOSIS — Z0000 Encounter for general adult medical examination without abnormal findings: Secondary | ICD-10-CM | POA: Diagnosis not present

## 2014-11-17 LAB — CBC
HEMATOCRIT: 36.5 % (ref 36.0–46.0)
HEMOGLOBIN: 12.2 g/dL (ref 12.0–15.0)
MCHC: 33.6 g/dL (ref 30.0–36.0)
MCV: 89.3 fl (ref 78.0–100.0)
PLATELETS: 183 10*3/uL (ref 150.0–400.0)
RBC: 4.09 Mil/uL (ref 3.87–5.11)
RDW: 14.3 % (ref 11.5–15.5)
WBC: 6.7 10*3/uL (ref 4.0–10.5)

## 2014-11-17 LAB — COMPREHENSIVE METABOLIC PANEL
ALBUMIN: 4 g/dL (ref 3.5–5.2)
ALT: 28 U/L (ref 0–35)
AST: 18 U/L (ref 0–37)
Alkaline Phosphatase: 65 U/L (ref 39–117)
BUN: 15 mg/dL (ref 6–23)
CO2: 29 meq/L (ref 19–32)
Calcium: 9.6 mg/dL (ref 8.4–10.5)
Chloride: 107 mEq/L (ref 96–112)
Creatinine, Ser: 0.83 mg/dL (ref 0.40–1.20)
GFR: 77.22 mL/min (ref >60.00)
Glucose, Bld: 81 mg/dL (ref 70–99)
POTASSIUM: 4.6 meq/L (ref 3.5–5.1)
Sodium: 141 mEq/L (ref 135–145)
Total Bilirubin: 0.3 mg/dL (ref 0.2–1.2)
Total Protein: 6.8 g/dL (ref 6.0–8.3)

## 2014-11-17 LAB — LIPID PANEL
CHOL/HDL RATIO: 3
Cholesterol: 131 mg/dL (ref 0–200)
HDL: 38.4 mg/dL — ABNORMAL LOW (ref >39.00)
LDL CALC: 73 mg/dL (ref 0–99)
NonHDL: 92.6
Triglycerides: 97 mg/dL (ref 0.0–149.0)
VLDL: 19.4 mg/dL (ref 0.0–40.0)

## 2014-11-22 ENCOUNTER — Encounter: Payer: Self-pay | Admitting: Internal Medicine

## 2014-11-22 ENCOUNTER — Ambulatory Visit (INDEPENDENT_AMBULATORY_CARE_PROVIDER_SITE_OTHER): Payer: BLUE CROSS/BLUE SHIELD | Admitting: Internal Medicine

## 2014-11-22 VITALS — BP 114/70 | HR 77 | Temp 98.6°F | Ht 68.75 in | Wt 268.0 lb

## 2014-11-22 DIAGNOSIS — Z1211 Encounter for screening for malignant neoplasm of colon: Secondary | ICD-10-CM | POA: Diagnosis not present

## 2014-11-22 DIAGNOSIS — E049 Nontoxic goiter, unspecified: Secondary | ICD-10-CM

## 2014-11-22 DIAGNOSIS — Z0000 Encounter for general adult medical examination without abnormal findings: Secondary | ICD-10-CM | POA: Diagnosis not present

## 2014-11-22 DIAGNOSIS — E010 Iodine-deficiency related diffuse (endemic) goiter: Secondary | ICD-10-CM

## 2014-11-22 LAB — TSH: TSH: 0.84 u[IU]/mL (ref 0.35–4.50)

## 2014-11-22 NOTE — Patient Instructions (Signed)

## 2014-11-22 NOTE — Progress Notes (Signed)
Pre visit review using our clinic review tool, if applicable. No additional management support is needed unless otherwise documented below in the visit note. 

## 2014-11-22 NOTE — Progress Notes (Signed)
Subjective:    Patient ID: Kathryn Mason, female    DOB: 07-15-1965, 49 y.o.   MRN: [id number]  HPI  Pt presents to the clinic today for her annual exam.  Flu: never Tetanus: 10/2013 Pap Smear: 2006, partial hysterectomy, but she did have a pelvic exam in 2015 Mammogram: 10/2013, she does need to schedule Colon Screening: never Vision Screening: yearly Dentist: as needed  Review of Systems      Past Medical History  Diagnosis Date  . Tubal pregnancy   . GERD (gastroesophageal reflux disease)   . Cervical cancer 2005  . Hx of migraines     Current Outpatient Prescriptions  Medication Sig Dispense Refill  . amoxicillin (AMOXIL) 875 MG tablet Take 1 tablet (875 mg total) by mouth 2 (two) times daily. 20 tablet 0  . Black Cohosh 40 MG CAPS Take 2 capsules by mouth at bedtime and may repeat dose one time if needed.    . calcium carbonate (TUMS - DOSED IN MG ELEMENTAL CALCIUM) 500 MG chewable tablet Chew 1 tablet by mouth daily.    Marland Kitchen esomeprazole (NEXIUM) 20 MG capsule Take 20 mg by mouth daily at 12 noon.    Marland Kitchen ibuprofen (ADVIL,MOTRIN) 200 MG tablet Take 600 mg by mouth as needed.    . Melatonin 5 MG CAPS Take 1 capsule by mouth at bedtime and may repeat dose one time if needed.    . Multiple Vitamins-Calcium (ONE-A-DAY WOMENS FORMULA PO) Take 1 capsule by mouth daily.    . Nutritional Supplements (ESTROVEN PO) Take 1 capsule by mouth daily.     No current facility-administered medications for this visit.    No Known Allergies  Family History  Problem Relation Age of Onset  . Heart disease Mother   . Heart disease Father   . Hypertension Father   . Cancer Father     leukemia  . Cancer Brother     brain    History   Social History  . Marital Status: Widowed    Spouse Name: N/A  . Number of Children: N/A  . Years of Education: N/A   Occupational History  . Not on file.   Social History Main Topics  . Smoking status: Former Smoker -- 0.33 packs/day for 30  years    Types: Cigarettes    Start date: 11/02/1983    Quit date: 03/31/2014  . Smokeless tobacco: Never Used  . Alcohol Use: 0.0 oz/week    0 Standard drinks or equivalent per week     Comment: occasional  . Drug Use: No  . Sexual Activity: No   Other Topics Concern  . Not on file   Social History Narrative   Widowed as of 2014     Constitutional: Denies fever, malaise, fatigue, headache or abrupt weight changes.  HEENT: Denies eye pain, eye redness, ear pain, ringing in the ears, wax buildup, runny nose, nasal congestion, bloody nose, or sore throat. Respiratory: Denies difficulty breathing, shortness of breath, cough or sputum production.   Cardiovascular: Denies chest pain, chest tightness, palpitations or swelling in the hands or feet.  Gastrointestinal: Pt reports constipation. Denies abdominal pain, bloating, diarrhea or blood in the stool.  GU: Denies urgency, frequency, pain with urination, burning sensation, blood in urine, odor or discharge. Musculoskeletal: Denies decrease in range of motion, difficulty with gait, muscle pain or joint pain and swelling.  Skin: Denies redness, rashes, lesions or ulcercations.  Neurological: Denies dizziness, difficulty with memory, difficulty with speech  or problems with balance and coordination.  Psych: Denies anxiety, depression, SI/HI.  No other specific complaints in a complete review of systems (except as listed in HPI above).  Objective:   Physical Exam  BP 114/70 mmHg  Pulse 77  Temp(Src) 98.6 F (37 C) (Oral)  Ht 5' 8.75" (1.746 m)  Wt 268 lb (121.564 kg)  BMI 39.88 kg/m2  SpO2 98% Wt Readings from Last 3 Encounters:  11/22/14 268 lb (121.564 kg)  08/11/14 261 lb 12.8 oz (118.752 kg)  02/07/14 250 lb 6.4 oz (113.581 kg)    General: Appears her stated age, obese in NAD. Skin: Warm, dry and intact. No rashes, lesions or ulcerations noted. HEENT: Head: normal shape and size; Eyes: sclera white, no icterus,  conjunctiva pink, PERRLA and EOMs intact; Ears: Tm's gray and intact, normal light reflex;  Throat/Mouth: Teeth present, mucosa pink and moist, no exudate, lesions or ulcerations noted.  Neck:  Neck supple, trachea midline. No masses, lumps present. Thyromegaly noted. Cardiovascular: Normal rate and rhythm. S1,S2 noted.  No murmur, rubs or gallops noted. No JVD or BLE edema. No carotid bruits noted. Pulmonary/Chest: Normal effort and positive vesicular breath sounds. No respiratory distress. No wheezes, rales or ronchi noted.  Abdomen: Soft and nontender. Normal bowel sounds, no bruits noted. No distention or masses noted. Liver, spleen and kidneys non palpable. Musculoskeletal: Strength 5/5 BUE/BLE. No signs of joint swelling. No difficulty with gait.  Neurological: Alert and oriented. Cranial nerves II-XII grossly intact. Coordination normal. Psychiatric: Mood and affect normal. Behavior is normal. Judgment and thought content normal.    BMET    Component Value Date/Time   NA 141 11/16/2014 1700   K 4.6 11/16/2014 1700   CL 107 11/16/2014 1700   CO2 29 11/16/2014 1700   GLUCOSE 81 11/16/2014 1700   BUN 15 11/16/2014 1700   CREATININE 0.83 11/16/2014 1700   CALCIUM 9.6 11/16/2014 1700    Lipid Panel     Component Value Date/Time   CHOL 131 11/16/2014 1700   TRIG 97.0 11/16/2014 1700   HDL 38.40* 11/16/2014 1700   CHOLHDL 3 11/16/2014 1700   VLDL 19.4 11/16/2014 1700   LDLCALC 73 11/16/2014 1700    CBC    Component Value Date/Time   WBC 6.7 11/16/2014 1700   RBC 4.09 11/16/2014 1700   HGB 12.2 11/16/2014 1700   HCT 36.5 11/16/2014 1700   PLT 183.0 11/16/2014 1700   MCV 89.3 11/16/2014 1700   MCHC 33.6 11/16/2014 1700   RDW 14.3 11/16/2014 1700    Hgb A1C Lab Results  Component Value Date   HGBA1C 6.1 09/22/2013         Assessment & Plan:   Preventative Health Maintenance:  Discussed the importance of working on diet and exercise She had labs done 1 week  prior to this appt, CBC, CMET and Lipid profile reviewed. Encouraged her to visit an eye doctor every 1-2 years She declines yearly flu shots Thyromegaly- will check TSH She declines colonoscopy but is agreeable to IFOB- ordered She will call to schedule her mammogram- she a a reminder card  RTC in 1 year or sooner if needed

## 2014-11-28 ENCOUNTER — Encounter: Payer: BLUE CROSS/BLUE SHIELD | Admitting: Internal Medicine

## 2014-12-08 ENCOUNTER — Other Ambulatory Visit (INDEPENDENT_AMBULATORY_CARE_PROVIDER_SITE_OTHER): Payer: BLUE CROSS/BLUE SHIELD

## 2014-12-08 DIAGNOSIS — Z1211 Encounter for screening for malignant neoplasm of colon: Secondary | ICD-10-CM

## 2014-12-08 LAB — FECAL OCCULT BLOOD, IMMUNOCHEMICAL: FECAL OCCULT BLD: NEGATIVE

## 2014-12-27 ENCOUNTER — Other Ambulatory Visit: Payer: Self-pay

## 2014-12-27 DIAGNOSIS — Z1231 Encounter for screening mammogram for malignant neoplasm of breast: Secondary | ICD-10-CM

## 2014-12-29 ENCOUNTER — Ambulatory Visit
Admission: RE | Admit: 2014-12-29 | Discharge: 2014-12-29 | Disposition: A | Discharge status: Home / Self Care | Payer: BLUE CROSS/BLUE SHIELD | Source: Ambulatory Visit

## 2014-12-29 DIAGNOSIS — Z1231 Encounter for screening mammogram for malignant neoplasm of breast: Secondary | ICD-10-CM

## 2015-08-14 ENCOUNTER — Ambulatory Visit: Payer: BLUE CROSS/BLUE SHIELD | Admitting: Pulmonary Disease

## 2015-08-22 ENCOUNTER — Ambulatory Visit (INDEPENDENT_AMBULATORY_CARE_PROVIDER_SITE_OTHER): Payer: 59 | Admitting: Pulmonary Disease

## 2015-08-22 ENCOUNTER — Encounter: Payer: Self-pay | Admitting: Pulmonary Disease

## 2015-08-22 VITALS — BP 118/70 | HR 67 | Ht 69.0 in | Wt 271.4 lb

## 2015-08-22 DIAGNOSIS — G4733 Obstructive sleep apnea (adult) (pediatric): Secondary | ICD-10-CM | POA: Diagnosis not present

## 2015-08-22 DIAGNOSIS — E669 Obesity, unspecified: Secondary | ICD-10-CM | POA: Diagnosis not present

## 2015-08-22 NOTE — Assessment & Plan Note (Signed)
Weight loss encouraged 

## 2015-08-22 NOTE — Progress Notes (Signed)
   Subjective:    Patient ID: Kathryn Mason, female    DOB: 1965/08/20, 50 y.o.   MRN: [id number]  HPI  Chief Complaint  Patient presents with  . Follow-up    Former [facility] Patient, doing well on CPAP, no concerns.   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 Also denies presyncope, palpitations, heartburn, abdominal pain, nausea, vomiting, diarrhea or change in bowel or urinary habits, dysuria,hematuria, rash, arthralgias, visual complaints, headache, numbness weakness or ataxia.     Objective:   Physical Exam  Gen. Pleasant, obese, in no distress ENT - no lesions, no post nasal drip Neck: No JVD, no thyromegaly, no carotid bruits Lungs: no use of accessory muscles, no dullness to percussion, decreased without rales or rhonchi  Cardiovascular: Rhythm regular, heart sounds  normal, no murmurs or gallops, no peripheral edema Musculoskeletal: No deformities, no cyanosis or clubbing , no tremors       Assessment & Plan:

## 2015-08-22 NOTE — Patient Instructions (Signed)
CPAP supplies will be renewed x 1 year 

## 2015-08-22 NOTE — Assessment & Plan Note (Signed)
CPAP supplies will be renewed x 1 year  compliance with goal of at least 4-6 hrs every night is the expectation. Advised against medications with sedative side effects Cautioned against driving when sleepy - understanding that sleepiness will vary on a day to day basis

## 2015-08-30 ENCOUNTER — Encounter: Payer: Self-pay | Admitting: Pulmonary Disease

## 2016-04-23 ENCOUNTER — Telehealth: Payer: 59 | Admitting: Family

## 2016-04-23 DIAGNOSIS — N390 Urinary tract infection, site not specified: Secondary | ICD-10-CM

## 2016-04-23 DIAGNOSIS — A499 Bacterial infection, unspecified: Secondary | ICD-10-CM

## 2016-04-23 MED ORDER — SULFAMETHOXAZOLE-TRIMETHOPRIM 800-160 MG PO TABS: 1.0000 | ORAL_TABLET | Freq: Two times a day (BID) | ORAL | 0 refills | Status: DC

## 2016-04-23 NOTE — Progress Notes (Signed)

## 2016-05-13 ENCOUNTER — Ambulatory Visit (INDEPENDENT_AMBULATORY_CARE_PROVIDER_SITE_OTHER): Payer: 59 | Admitting: Family Medicine

## 2016-05-13 ENCOUNTER — Encounter: Payer: Self-pay | Admitting: Family Medicine

## 2016-05-13 VITALS — BP 116/68 | HR 61 | Temp 98.5°F | Ht 68.75 in | Wt 276.0 lb

## 2016-05-13 DIAGNOSIS — N3000 Acute cystitis without hematuria: Secondary | ICD-10-CM | POA: Insufficient documentation

## 2016-05-13 DIAGNOSIS — N3001 Acute cystitis with hematuria: Secondary | ICD-10-CM

## 2016-05-13 DIAGNOSIS — R319 Hematuria, unspecified: Secondary | ICD-10-CM

## 2016-05-13 DIAGNOSIS — R05 Cough: Secondary | ICD-10-CM | POA: Diagnosis not present

## 2016-05-13 DIAGNOSIS — F17200 Nicotine dependence, unspecified, uncomplicated: Secondary | ICD-10-CM | POA: Diagnosis not present

## 2016-05-13 DIAGNOSIS — R058 Other specified cough: Secondary | ICD-10-CM | POA: Insufficient documentation

## 2016-05-13 LAB — POC URINALSYSI DIPSTICK (AUTOMATED)
Bilirubin, UA: NEGATIVE
Glucose, UA: NEGATIVE
Ketones, UA: NEGATIVE
Nitrite, UA: NEGATIVE
SPEC GRAV UA: 1.015
Urobilinogen, UA: 0.2
pH, UA: 6

## 2016-05-13 MED ORDER — LEVOFLOXACIN 500 MG PO TABS: 500.0000 mg | ORAL_TABLET | Freq: Every day | ORAL | 0 refills | Status: DC

## 2016-05-13 NOTE — Assessment & Plan Note (Signed)
Disc in detail risks of smoking and possible outcomes including copd, vascular/ heart disease, cancer , respiratory and sinus infections  Pt voices understanding Not yet ready to quit

## 2016-05-13 NOTE — Assessment & Plan Note (Signed)
Re assuring exam in a smoker Disc use of an expectorant  Covering her uti with levaquin as well  Update if not starting to improve in a week or if worsening

## 2016-05-13 NOTE — Progress Notes (Signed)
Pre visit review using our clinic review tool, if applicable. No additional management support is needed unless otherwise documented below in the visit note. 

## 2016-05-13 NOTE — Patient Instructions (Addendum)
Drink lots of water (avoid soda and tea and other beverages as much as possible)  mucinex is fine  Take the levaquin as directed Update if not starting to improve in several days  or if worsening   If blood in urine returns - you need a referral to urology so let us know   We will contact you regarding urine culture results when they return    Urinary Tract Infection Urinary tract infections (UTIs) can develop anywhere along your urinary tract. Your urinary tract is your body's drainage system for removing wastes and extra water. Your urinary tract includes two kidneys, two ureters, a bladder, and a urethra. Your kidneys are a pair of bean-shaped organs. Each kidney is about the size of your fist. They are located below your ribs, one on each side of your spine. CAUSES Infections are caused by microbes, which are microscopic organisms, including fungi, viruses, and bacteria. These organisms are so small that they can only be seen through a microscope. Bacteria are the microbes that most commonly cause UTIs. SYMPTOMS  Symptoms of UTIs may vary by age and gender of the patient and by the location of the infection. Symptoms in young women typically include a frequent and intense urge to urinate and a painful, burning feeling in the bladder or urethra during urination. Older women and men are more likely to be tired, shaky, and weak and have muscle aches and abdominal pain. A fever may mean the infection is in your kidneys. Other symptoms of a kidney infection include pain in your back or sides below the ribs, nausea, and vomiting. DIAGNOSIS To diagnose a UTI, your caregiver will ask you about your symptoms. Your caregiver will also ask you to provide a urine sample. The urine sample will be tested for bacteria and white blood cells. White blood cells are made by your body to help fight infection. TREATMENT  Typically, UTIs can be treated with medication. Because most UTIs are caused by a bacterial  infection, they usually can be treated with the use of antibiotics. The choice of antibiotic and length of treatment depend on your symptoms and the type of bacteria causing your infection. HOME CARE INSTRUCTIONS  If you were prescribed antibiotics, take them exactly as your caregiver instructs you. Finish the medication even if you feel better after you have only taken some of the medication.  Drink enough water and fluids to keep your urine clear or pale yellow.  Avoid caffeine, tea, and carbonated beverages. They tend to irritate your bladder.  Empty your bladder often. Avoid holding urine for long periods of time.  Empty your bladder before and after sexual intercourse.  After a bowel movement, women should cleanse from front to back. Use each tissue only once. SEEK MEDICAL CARE IF:   You have back pain.  You develop a fever.  Your symptoms do not begin to resolve within 3 days. SEEK IMMEDIATE MEDICAL CARE IF:   You have severe back pain or lower abdominal pain.  You develop chills.  You have nausea or vomiting.  You have continued burning or discomfort with urination. MAKE SURE YOU:   Understand these instructions.  Will watch your condition.  Will get help right away if you are not doing well or get worse.   This information is not intended to replace advice given to you by your health care provider. Make sure you discuss any questions you have with your health care provider.   Document Released: 04/03/2005 Document Revised:  03/15/2015 Document Reviewed: 08/02/2011 Elsevier Interactive Patient Education [employer].

## 2016-05-13 NOTE — Progress Notes (Signed)
Subjective:    Patient ID: Kathryn Mason, female    DOB: 1966/05/20, 50 y.o.   MRN: [id number]  HPI Here for hematuria This started this morning She used the bathroom and noted blood (not pink however) Has had a hysterectomy  Then some frequency and urgency  Had some mild dysuria last night   No fever  A little bladder pressure  No n/v   No hx of frequent uti No hx of kidney stones  fam hx : mother had a lot of kidney infections   Does not drink enough water   No chance it is vaginal bleeding   Results for orders placed or performed in visit on 05/13/16  POCT Urinalysis Dipstick (Automated)  Result Value Ref Range   Color, UA Yellow    Clarity, UA Hazy    Glucose, UA Negative    Bilirubin, UA Negative    Ketones, UA Negative    Spec Grav, UA 1.015    Blood, UA 25 Ery/uL    pH, UA 6.0    Protein, UA Trace    Urobilinogen, UA 0.2    Nitrite, UA Negative    Leukocytes, UA moderate (2+) (A) Negative     She was treated for uti with e visit with bactrim in mid oct She did feel better   Pt is a smoker Smokes 1/2 ppd  No plans to quit in the near future  She supplements with e-cig (vap) -to help keep the real cig to a minimum  Also some nasal congestion and lung congestion  Had a cold  Phlegm when she coughs - has a funny taste to it (green)  No wheeze or sob   Patient Active Problem List   Diagnosis Date Noted  . Acute cystitis 05/13/2016  . Productive cough 05/13/2016  . Tennis elbow 02/02/2014  . Obesity (BMI 30-39.9) 09/22/2013  . GERD (gastroesophageal reflux disease) 09/22/2013  . OSA (obstructive sleep apnea) 09/22/2013  . Menopausal symptoms 09/22/2013  . Dry mouth 09/22/2013  . Smoker 09/22/2013   Past Medical History:  Diagnosis Date  . Cervical cancer ([place]) 2005  . GERD (gastroesophageal reflux disease)   . Hx of migraines   . Tubal pregnancy    Past Surgical History:  Procedure Laterality Date  . ABDOMINAL HYSTERECTOMY  2005  .  CHOLECYSTECTOMY  1987  . TUBAL LIGATION  1992   Social History  Substance Use Topics  . Smoking status: Current Every Day Smoker    Packs/day: 0.50    Years: 30.00    Types: Cigarettes    Start date: 11/02/1983  . Smokeless tobacco: Never Used  . Alcohol use 0.0 oz/week     Comment: rare   Family History  Problem Relation Age of Onset  . Heart disease Mother   . Heart disease Father   . Hypertension Father   . Cancer Father     leukemia  . Cancer Brother     brain   No Known Allergies Current Outpatient Prescriptions on File Prior to Visit  Medication Sig Dispense Refill  . calcium carbonate (TUMS - DOSED IN MG ELEMENTAL CALCIUM) 500 MG chewable tablet Chew 1 tablet by mouth daily.    Mariane Baumgarten Calcium (STOOL SOFTENER PO) Take 1 tablet by mouth daily. Reported on 08/22/2015    . esomeprazole (NEXIUM) 20 MG capsule Take 20 mg by mouth daily at 12 noon.    Marland Kitchen ibuprofen (ADVIL,MOTRIN) 200 MG tablet Take 600 mg by mouth as needed.    Marland Kitchen  Multiple Vitamins-Calcium (ONE-A-DAY WOMENS FORMULA PO) Take 1 capsule by mouth daily.     No current facility-administered medications on file prior to visit.     Review of Systems  Constitutional: Positive for fatigue. Negative for activity change, appetite change and fever.  HENT: Positive for postnasal drip, rhinorrhea and sinus pressure. Negative for congestion, sinus pain and sore throat.   Eyes: Negative for itching and visual disturbance.  Respiratory: Negative for cough, shortness of breath and wheezing.   Cardiovascular: Negative for leg swelling.  Gastrointestinal: Negative for abdominal distention, abdominal pain, constipation, diarrhea and nausea.  Endocrine: Negative for cold intolerance and polydipsia.  Genitourinary: Positive for dysuria, frequency and urgency. Negative for difficulty urinating, flank pain and hematuria.  Musculoskeletal: Negative for myalgias.  Skin: Negative for rash.  Allergic/Immunologic: Negative for  immunocompromised state.  Neurological: Negative for dizziness and weakness.  Hematological: Negative for adenopathy.      Objective:   Physical Exam  Constitutional: She appears well-developed and well-nourished. No distress.  Well appearing   HENT:  Head: Normocephalic and atraumatic.  Right Ear: External ear normal.  Left Ear: External ear normal.  Mouth/Throat: Oropharynx is clear and moist.  Nares are injected and congested  No sinus tenderness Clear rhinorrhea and post nasal drip   Eyes: Conjunctivae and EOM are normal. Pupils are equal, round, and reactive to light. Right eye exhibits no discharge. Left eye exhibits no discharge.  Neck: Normal range of motion. Neck supple.  Cardiovascular: Normal rate, regular rhythm and normal heart sounds.   Pulmonary/Chest: Effort normal and breath sounds normal. No respiratory distress. She has no wheezes. She has no rales. She exhibits no tenderness.  Harsh bs/slightly distant  No wheezing No rales or rhonchi Mild upper airway sounds    Abdominal: Soft. Bowel sounds are normal. She exhibits no distension. There is tenderness. There is no rebound.  No cva tenderness  Mild suprapubic tenderness  Musculoskeletal: She exhibits no edema.  Lymphadenopathy:    She has no cervical adenopathy.  Neurological: She is alert.  Skin: Skin is warm and dry. No rash noted.  Psychiatric: She has a normal mood and affect.          Assessment & Plan:   Problem List Items Addressed This Visit      Genitourinary   Acute cystitis - Primary    With hematuria  S/p suspected uti last mo tx with bactrim  Cover with levaquin Pending ucx  Handout given Update if not starting to improve in a several days or if worsening   If no imp in hematuria-will need to see urology      Relevant Orders   Urine culture     Other   Productive cough    Re assuring exam in a smoker Disc use of an expectorant  Covering her uti with levaquin as well    Update if not starting to improve in a week or if worsening        Smoker    Disc in detail risks of smoking and possible outcomes including copd, vascular/ heart disease, cancer , respiratory and sinus infections  Pt voices understanding Not yet ready to quit        Other Visit Diagnoses    Hematuria, unspecified type       Relevant Orders   POCT Urinalysis Dipstick (Automated) (Completed)

## 2016-05-13 NOTE — Assessment & Plan Note (Signed)
With hematuria  S/p suspected uti last mo tx with bactrim  Cover with levaquin Pending ucx  Handout given Update if not starting to improve in a several days or if worsening   If no imp in hematuria-will need to see urology

## 2016-05-15 LAB — URINE CULTURE

## 2016-08-20 ENCOUNTER — Encounter: Payer: Self-pay | Admitting: Adult Health

## 2016-08-21 ENCOUNTER — Ambulatory Visit (INDEPENDENT_AMBULATORY_CARE_PROVIDER_SITE_OTHER): Payer: 59 | Admitting: Adult Health

## 2016-08-21 ENCOUNTER — Encounter: Payer: Self-pay | Admitting: Adult Health

## 2016-08-21 DIAGNOSIS — G4733 Obstructive sleep apnea (adult) (pediatric): Secondary | ICD-10-CM | POA: Diagnosis not present

## 2016-08-21 DIAGNOSIS — E669 Obesity, unspecified: Secondary | ICD-10-CM | POA: Diagnosis not present

## 2016-08-21 NOTE — Patient Instructions (Signed)
Keep up the good work.  Continue on C Pap at bedtime Do not drive a sleepy Stay active, work on weight loss.  Follow-up with Dr. Elsworth Soho in 1 year and as needed

## 2016-08-21 NOTE — Progress Notes (Signed)
@Patient  ID: Kathryn Mason, female    DOB: 1965/08/22, 51 y.o.   MRN: [id number]  Chief Complaint  Patient presents with  . Follow-up    OSA     Referring provider: Jearld Fenton, NP  HPI: 51 year old female followed for severe obstructive sleep apnea on C Pap at bedtime  TEST  HST 12/2013:  AHI 49/hr  08/21/2016 Follow up : OSA  Pt returns for a yearly follow up for severe sleep apnea. Patient says she is doing very well on her C Pap machine. She wears a refill night for about 8 hours. She says she cannot live without it.. She feels rested. Download shows excellent compliance with average usage at around 8 hours. She is on AutoSet 5-20 cm of H2O. AHI is 1.3. Minimum leaks.   No Known Allergies  Immunization History  Administered Date(s) Administered  . Tdap 10/11/2013    Past Medical History:  Diagnosis Date  . Cervical cancer ([place]) 2005  . GERD (gastroesophageal reflux disease)   . Hx of migraines   . Tubal pregnancy     Tobacco History: History  Smoking Status  . Current Every Day Smoker  . Packs/day: 0.50  . Years: 30.00  . Types: Cigarettes  . Start date: 11/02/1983  Smokeless Tobacco  . Never Used   Ready to quit: Not Answered Counseling given: Not Answered   Outpatient Encounter Prescriptions as of 08/21/2016  Medication Sig  . calcium carbonate (TUMS - DOSED IN MG ELEMENTAL CALCIUM) 500 MG chewable tablet Chew 1 tablet by mouth daily.  Mariane Baumgarten Calcium (STOOL SOFTENER PO) Take 1 tablet by mouth daily as needed. Reported on 08/22/2015  . esomeprazole (NEXIUM) 20 MG capsule Take 20 mg by mouth daily at 12 noon.  Marland Kitchen ibuprofen (ADVIL,MOTRIN) 200 MG tablet Take 600 mg by mouth as needed.  . Multiple Vitamins-Calcium (ONE-A-DAY WOMENS FORMULA PO) Take 1 capsule by mouth daily.  . [DISCONTINUED] levofloxacin (LEVAQUIN) 500 MG tablet Take 1 tablet (500 mg total) by mouth daily. (Patient not taking: Reported on 08/21/2016)   No facility-administered  encounter medications on file as of 08/21/2016.      Review of Systems  Constitutional:   No  weight loss, night sweats,  Fevers, chills, fatigue, or  lassitude.  HEENT:   No headaches,  Difficulty swallowing,  Tooth/dental problems, or  Sore throat,                No sneezing, itching, ear ache, nasal congestion, post nasal drip,   CV:  No chest pain,  Orthopnea, PND, swelling in lower extremities, anasarca, dizziness, palpitations, syncope.   GI  No heartburn, indigestion, abdominal pain, nausea, vomiting, diarrhea, change in bowel habits, loss of appetite, bloody stools.   Resp: No shortness of breath with exertion or at rest.  No excess mucus, no productive cough,  No non-productive cough,  No coughing up of blood.  No change in color of mucus.  No wheezing.  No chest wall deformity  Skin: no rash or lesions.  GU: no dysuria, change in color of urine, no urgency or frequency.  No flank pain, no hematuria   MS:  No joint pain or swelling.  No decreased range of motion.  No back pain.    Physical Exam  BP 126/72 (BP Location: Left Arm, Cuff Size: Large)   Pulse 74   Ht 5\' 9"  (1.753 m)   Wt 277 lb (125.6 kg)   SpO2 98%   BMI 40.91  kg/m   GEN: A/Ox3; pleasant , NAD, well nourished    HEENT:  [place]/AT,  EACs-clear, TMs-wnl, NOSE-clear, THROAT-clear, no lesions, no postnasal drip or exudate noted. Class 2-3 MP airway   NECK:  Supple w/ fair ROM; no JVD; normal carotid impulses w/o bruits; no thyromegaly or nodules palpated; no lymphadenopathy.    RESP  Clear  P & A; w/o, wheezes/ rales/ or rhonchi. no accessory muscle use, no dullness to percussion  CARD:  RRR, no m/r/g, no peripheral edema, pulses intact, no cyanosis or clubbing.  GI:   Soft & nt; nml bowel sounds; no organomegaly or masses detected.   Musco: Warm bil, no deformities or joint swelling noted.   Neuro: alert, no focal deficits noted.    Skin: Warm, no lesions or rashes  Psych:  No change in mood or  affect. No depression or anxiety.  No memory loss.  Lab Results:  CBC    Component Value Date/Time   WBC 6.7 11/16/2014 1700   RBC 4.09 11/16/2014 1700   HGB 12.2 11/16/2014 1700   HCT 36.5 11/16/2014 1700   PLT 183.0 11/16/2014 1700   MCV 89.3 11/16/2014 1700   MCHC 33.6 11/16/2014 1700   RDW 14.3 11/16/2014 1700    BMET    Component Value Date/Time   NA 141 11/16/2014 1700   K 4.6 11/16/2014 1700   CL 107 11/16/2014 1700   CO2 29 11/16/2014 1700   GLUCOSE 81 11/16/2014 1700   BUN 15 11/16/2014 1700   CREATININE 0.83 11/16/2014 1700   CALCIUM 9.6 11/16/2014 1700    BNP No results found for: BNP  ProBNP No results found for: PROBNP  Imaging: No results found.   Assessment & Plan:   OSA (obstructive sleep apnea) Compensated on CPAP   Plan  Continue on CPAP At bedtime     Obesity (BMI 30-39.9) WT loss.      Rexene Edison, NP 08/21/2016

## 2016-08-21 NOTE — Assessment & Plan Note (Signed)
WT loss.

## 2016-08-21 NOTE — Assessment & Plan Note (Signed)
Compensated on CPAP   Plan  Continue on CPAP At bedtime

## 2016-08-28 NOTE — Progress Notes (Signed)
Reviewed & agree with plan  

## 2016-09-24 DIAGNOSIS — G4733 Obstructive sleep apnea (adult) (pediatric): Secondary | ICD-10-CM | POA: Diagnosis not present

## 2017-05-14 DIAGNOSIS — G4733 Obstructive sleep apnea (adult) (pediatric): Secondary | ICD-10-CM | POA: Diagnosis not present

## 2017-05-30 DIAGNOSIS — J069 Acute upper respiratory infection, unspecified: Secondary | ICD-10-CM | POA: Diagnosis not present

## 2017-12-27 DIAGNOSIS — N390 Urinary tract infection, site not specified: Secondary | ICD-10-CM | POA: Diagnosis not present

## 2018-06-29 ENCOUNTER — Telehealth: Payer: Self-pay | Admitting: Adult Health

## 2018-06-29 NOTE — Telephone Encounter (Signed)
Called and spoke with Patient. She is requesting CPAP supplies. Last OV was 08/21/16, with TP.  Appointment scheduled for 07/02/18, with Lazaro Arms, NP.  Nothing further at this time.

## 2018-07-02 ENCOUNTER — Encounter: Payer: Self-pay | Admitting: Nurse Practitioner

## 2018-07-02 ENCOUNTER — Ambulatory Visit: Payer: 59 | Admitting: Nurse Practitioner

## 2018-07-02 VITALS — BP 132/70 | HR 82 | Ht 69.0 in | Wt 270.2 lb

## 2018-07-02 DIAGNOSIS — G4733 Obstructive sleep apnea (adult) (pediatric): Secondary | ICD-10-CM

## 2018-07-02 NOTE — Progress Notes (Signed)
@Patient  ID: Kathryn Mason, female    DOB: February 05, 1966, 52 y.o.   MRN: [id number]  Chief Complaint  Patient presents with  . Other    CPAP Compliance     Referring provider: Jearld Fenton, NP  HPI 52 year old smoker followed for severe obstructive sleep apnea by Dr. Elsworth Soho.  Tests: HST 12/2013: AHI 49/hr  OV 07/02/18 -follow-up OSA Patient presents for follow-up on severe sleep apnea.  She states that she is doing well on her CPAP.  She is compliant with CPAP and wears it every night.  She usually sleeps around 7 hours.  He states that she feels much less drowsy during the day after wearing it.  He denies any nightmares or sleepwalking.  She does state that sometimes she feels like the pressure is too high.  He would like to switch DME companies.  [facility]  She needs new supplies..    CPAP compliance report 04/03/18 - 07/01/18: usage days 90/90 (100%), average usage 7 hours 25 minutes, CPAP AutoSet 5-20 cm H20, AHI: 1.5.     No Known Allergies  Immunization History  Administered Date(s) Administered  . Tdap 10/11/2013    Past Medical History:  Diagnosis Date  . Cervical cancer ([place]) 2005  . GERD (gastroesophageal reflux disease)   . Hx of migraines   . Tubal pregnancy     Tobacco History: Social History   Tobacco Use  Smoking Status Current Every Day Smoker  . Packs/day: 0.50  . Years: 30.00  . Pack years: 15.00  . Types: Cigarettes  . Start date: 11/02/1983  Smokeless Tobacco Never Used   Ready to quit: Not Answered Counseling given: Not Answered   Outpatient Encounter Medications as of 07/02/2018  Medication Sig  . calcium carbonate (TUMS - DOSED IN MG ELEMENTAL CALCIUM) 500 MG chewable tablet Chew 1 tablet by mouth daily.  Mariane Baumgarten Calcium (STOOL SOFTENER PO) Take 1 tablet by mouth daily as needed. Reported on 08/22/2015  . esomeprazole (NEXIUM) 20 MG capsule Take 20 mg by mouth daily at 12 noon.  Marland Kitchen ibuprofen  (ADVIL,MOTRIN) 200 MG tablet Take 600 mg by mouth as needed.  . Multiple Vitamins-Calcium (ONE-A-DAY WOMENS FORMULA PO) Take 1 capsule by mouth daily.   No facility-administered encounter medications on file as of 07/02/2018.      Review of Systems  Review of Systems  Constitutional: Negative.  Negative for chills and fever.  HENT: Negative.  Negative for congestion.   Respiratory: Negative for cough, shortness of breath and wheezing.   Cardiovascular: Negative.  Negative for chest pain, palpitations and leg swelling.  Gastrointestinal: Negative.   Allergic/Immunologic: Negative.   Neurological: Negative.   Psychiatric/Behavioral: Negative.        Physical Exam  BP 132/70 (BP Location: Left Arm, Patient Position: Sitting, Cuff Size: Large)   Pulse 82   Ht 5\' 9"  (1.753 m)   Wt 270 lb 3.2 oz (122.6 kg)   SpO2 100%   BMI 39.90 kg/m   Wt Readings from Last 5 Encounters:  07/02/18 270 lb 3.2 oz (122.6 kg)  08/21/16 277 lb (125.6 kg)  05/13/16 276 lb (125.2 kg)  08/22/15 271 lb 6.4 oz (123.1 kg)  11/22/14 268 lb (121.6 kg)     Physical Exam Vitals signs and nursing note reviewed.  Constitutional:      General: She is not in acute distress.    Appearance: She is well-developed.  Cardiovascular:     Rate  and Rhythm: Normal rate and regular rhythm.  Pulmonary:     Effort: Pulmonary effort is normal.     Breath sounds: Normal breath sounds.  Neurological:     Mental Status: She is alert and oriented to person, place, and time.       Assessment & Plan:   OSA (obstructive sleep apnea) Patient Instructions  Patient is requesting new DME company Patient continues to benefit from CPAP with good compliance and control documented Will order new CPAP supplies Change CPAP settings to 5-15 cm H20  Continue current medications Goal of 4 hours or more usage per night Maintain healthy weight Do not drive if drowsy Follow up with Dr. Elsworth Soho  in 1 year or sooner if  needed        Fenton Foy, NP 07/02/2018

## 2018-07-02 NOTE — Patient Instructions (Addendum)
Patient is requesting new DME company Patient continues to benefit from CPAP with good compliance and control documented Will order new CPAP supplies Change CPAP settings to 5-15 cm H20  Continue current medications Goal of 4 hours or more usage per night Maintain healthy weight Do not drive if drowsy Follow up with Dr. Elsworth Soho  in 1 year or sooner if needed

## 2018-07-02 NOTE — Assessment & Plan Note (Signed)
Patient Instructions  Patient is requesting new DME company Patient continues to benefit from CPAP with good compliance and control documented Will order new CPAP supplies Change CPAP settings to 5-15 cm H20  Continue current medications Goal of 4 hours or more usage per night Maintain healthy weight Do not drive if drowsy Follow up with Dr. Elsworth Soho  in 1 year or sooner if needed

## 2018-07-13 ENCOUNTER — Telehealth: Payer: Self-pay | Admitting: Adult Health

## 2018-07-13 NOTE — Telephone Encounter (Signed)
Called and spoke with Kathryn Mason.  She stated that the Patient should receive a call this afternoon or in the morning.  Called and spoke with Patient.  Explained someone will contact her from [facility] shortly.  Understanding stated.  Nothing further at this time.

## 2018-07-20 DIAGNOSIS — G4733 Obstructive sleep apnea (adult) (pediatric): Secondary | ICD-10-CM | POA: Diagnosis not present

## 2019-07-22 ENCOUNTER — Ambulatory Visit: Payer: 59 | Attending: Internal Medicine

## 2019-07-22 DIAGNOSIS — Z20822 Contact with and (suspected) exposure to covid-19: Secondary | ICD-10-CM

## 2019-07-23 LAB — NOVEL CORONAVIRUS, NAA: SARS-CoV-2, NAA: NOT DETECTED

## 2021-06-24 ENCOUNTER — Telehealth: Payer: 59 | Admitting: Physician Assistant

## 2021-06-24 DIAGNOSIS — U071 COVID-19: Secondary | ICD-10-CM | POA: Diagnosis not present

## 2021-06-24 MED ORDER — MOLNUPIRAVIR EUA 200MG CAPSULE: 4.0000 | ORAL_CAPSULE | Freq: Two times a day (BID) | ORAL | 0 refills | Status: AC

## 2021-06-24 NOTE — Progress Notes (Signed)
Ms. pessy, delamar are scheduled for a virtual visit with your provider today.    Just as we do with appointments in the office, we must obtain your consent to participate.  Your consent will be active for this visit and any virtual visit you may have with one of our providers in the next 365 days.    If you have a MyChart account, I can also send a copy of this consent to you electronically.  All virtual visits are billed to your insurance company just like a traditional visit in the office.  As this is a virtual visit, video technology does not allow for your provider to perform a traditional examination.  This may limit your provider's ability to fully assess your condition.  If your provider identifies any concerns that need to be evaluated in person or the need to arrange testing such as labs, EKG, etc, we will make arrangements to do so.    Although advances in technology are sophisticated, we cannot ensure that it will always work on either your end or our end.  If the connection with a video visit is poor, we may have to switch to a telephone visit.  With either a video or telephone visit, we are not always able to ensure that we have a secure connection.   I need to obtain your verbal consent now.   Are you willing to proceed with your visit today?   Kathryn Mason has provided verbal consent on 06/24/2021 for a virtual visit (video or telephone).   Kathryn Butts, PA-C 06/24/2021  12:54 PM   Date:  06/24/2021   ID:  Kathryn Mason, DOB 1966/06/05, MRN [id number]  Patient Location: Home Provider Location: Home Office   Participants: Patient and Provider for Visit and Wrap up  Method of visit: Video  Location of Patient: Home Location of Provider: Home Office Consent was obtain for visit over the video. Services rendered by provider: Visit was performed via video  A video enabled telemedicine application was used and I verified that I am speaking with the correct person using  two identifiers.  PCP:  Kathryn Fenton, NP   Chief Complaint:  COVID-19  History of Present Illness:    Kathryn Mason is a 55 y.o. female with history as stated below. Presents video telehealth for an acute care visit.   Onset of symptoms was this morning and symptoms have been persistent and include: nasal congestion, cough, fatigue, myalgias, chills.   Pt is not vaccinated and has not had COVID before.  Modifying factors include: none  Co-worker was positive on Friday and in the office with patient on Thursday.   No other aggravating or relieving factors.  No other c/o. No hx of asthma or CHF.  No shortness of breath today.  Records reviewed - no bloodwork on file since 2016 - cannot take Paxlovid.  Past Medical, Surgical, Social History, Allergies, and Medications have been Reviewed.  Patient Active Problem List   Diagnosis Date Noted   Acute cystitis 05/13/2016   Productive cough 05/13/2016   Tennis elbow 02/02/2014   Obesity (BMI 30-39.9) 09/22/2013   GERD (gastroesophageal reflux disease) 09/22/2013   OSA (obstructive sleep apnea) 09/22/2013   Menopausal symptoms 09/22/2013   Dry mouth 09/22/2013   Smoker 09/22/2013    Social History   Tobacco Use   Smoking status: Every Day    Packs/day: 0.50    Years: 30.00    Pack years: 15.00    Types:  Cigarettes    Start date: 11/02/1983   Smokeless tobacco: Never  Substance Use Topics   Alcohol use: Yes    Alcohol/week: 0.0 standard drinks    Comment: rare     Current Outpatient Medications:    molnupiravir EUA (LAGEVRIO) 200 mg CAPS capsule, Take 4 capsules (800 mg total) by mouth 2 (two) times daily for 5 days., Disp: 40 capsule, Rfl: 0   calcium carbonate (TUMS - DOSED IN MG ELEMENTAL CALCIUM) 500 MG chewable tablet, Chew 1 tablet by mouth daily., Disp: , Rfl:    Docusate Calcium (STOOL SOFTENER PO), Take 1 tablet by mouth daily as needed. Reported on 08/22/2015, Disp: , Rfl:    esomeprazole (NEXIUM) 20 MG  capsule, Take 20 mg by mouth daily at 12 noon., Disp: , Rfl:    ibuprofen (ADVIL,MOTRIN) 200 MG tablet, Take 600 mg by mouth as needed., Disp: , Rfl:    Multiple Vitamins-Calcium (ONE-A-DAY WOMENS FORMULA PO), Take 1 capsule by mouth daily., Disp: , Rfl:    No Known Allergies   Review of Systems  Constitutional:  Positive for chills and malaise/fatigue. Negative for fever.  HENT:  Positive for congestion. Negative for ear pain and sore throat.   Eyes:  Negative for blurred vision and double vision.  Respiratory:  Positive for cough. Negative for shortness of breath and wheezing.   Cardiovascular:  Negative for chest pain, palpitations and leg swelling.  Gastrointestinal:  Negative for abdominal pain, diarrhea, nausea and vomiting.  Genitourinary:  Negative for dysuria.  Musculoskeletal:  Positive for myalgias.  Skin:  Negative for rash.  Neurological:  Positive for headaches. Negative for loss of consciousness and weakness.  Psychiatric/Behavioral:  The patient is not nervous/anxious.   See HPI for history of present illness.  Physical Exam Constitutional:      General: She is not in acute distress.    Appearance: Normal appearance. She is not ill-appearing.  HENT:     Head: Normocephalic and atraumatic.     Nose: No congestion.  Eyes:     Extraocular Movements: Extraocular movements intact.  Pulmonary:     Effort: Pulmonary effort is normal.  Musculoskeletal:        General: Normal range of motion.     Cervical back: Normal range of motion.  Skin:    Coloration: Skin is not pale.  Neurological:     General: No focal deficit present.     Mental Status: She is alert. Mental status is at baseline.  Psychiatric:        Mood and Affect: Mood normal.              A&P  1. COVID-19  - Molnupiravir  - rest, drink plenty of fluids  - cold and flu medications as needed for other symptoms  - in person visit for shortness of breath   Patient voiced understanding and agreement  to plan.   Time:   Today, I have spent 15 minutes with the patient with telehealth technology discussing the above problems, reviewing the chart, previous notes, medications and orders.    Tests Ordered: No orders of the defined types were placed in this encounter.   Medication Changes: Meds ordered this encounter  Medications   molnupiravir EUA (LAGEVRIO) 200 mg CAPS capsule    Sig: Take 4 capsules (800 mg total) by mouth 2 (two) times daily for 5 days.    Dispense:  40 capsule    Refill:  0     Disposition:  Follow up In person as needed for additional evaluation  Signed, Kathryn Butts, PA-C  06/24/2021 12:54 PM

## 2021-06-24 NOTE — Patient Instructions (Signed)
1. COVID-19  - Molnupiravir  - rest, drink plenty of fluids  - cold and flu medications as needed for other symptoms  - in person visit for shortness of breath   [email or web address]

## 2021-10-08 ENCOUNTER — Other Ambulatory Visit: Payer: Self-pay | Admitting: Home Modifications

## 2021-10-08 DIAGNOSIS — Z1231 Encounter for screening mammogram for malignant neoplasm of breast: Secondary | ICD-10-CM

## 2021-10-09 ENCOUNTER — Other Ambulatory Visit: Payer: Self-pay | Admitting: Home Modifications

## 2021-10-09 ENCOUNTER — Ambulatory Visit
Admission: RE | Admit: 2021-10-09 | Discharge: 2021-10-09 | Disposition: A | Discharge status: Home / Self Care | Payer: 59 | Source: Ambulatory Visit | Attending: Home Modifications | Admitting: Home Modifications

## 2021-10-09 DIAGNOSIS — M25551 Pain in right hip: Secondary | ICD-10-CM

## 2021-10-09 DIAGNOSIS — Z1231 Encounter for screening mammogram for malignant neoplasm of breast: Secondary | ICD-10-CM

## 2021-10-09 DIAGNOSIS — M25511 Pain in right shoulder: Secondary | ICD-10-CM

## 2021-10-16 ENCOUNTER — Other Ambulatory Visit: Payer: Self-pay | Admitting: Home Modifications

## 2021-10-16 DIAGNOSIS — Z122 Encounter for screening for malignant neoplasm of respiratory organs: Secondary | ICD-10-CM

## 2021-10-22 ENCOUNTER — Ambulatory Visit
Admission: RE | Admit: 2021-10-22 | Discharge: 2021-10-22 | Disposition: A | Discharge status: Home / Self Care | Payer: 59 | Source: Ambulatory Visit | Attending: Home Modifications | Admitting: Home Modifications

## 2021-10-22 DIAGNOSIS — Z122 Encounter for screening for malignant neoplasm of respiratory organs: Secondary | ICD-10-CM

## 2022-01-07 ENCOUNTER — Other Ambulatory Visit: Payer: Self-pay | Admitting: Home Modifications

## 2022-01-07 DIAGNOSIS — R918 Other nonspecific abnormal finding of lung field: Secondary | ICD-10-CM

## 2022-01-09 ENCOUNTER — Other Ambulatory Visit: Payer: Self-pay

## 2022-02-05 ENCOUNTER — Encounter: Payer: Self-pay | Admitting: Cardiology

## 2022-02-05 ENCOUNTER — Ambulatory Visit: Payer: 59 | Admitting: Cardiology

## 2022-02-05 VITALS — BP 124/72 | HR 60 | Resp 20 | Ht 69.0 in | Wt 272.2 lb

## 2022-02-05 DIAGNOSIS — F17200 Nicotine dependence, unspecified, uncomplicated: Secondary | ICD-10-CM

## 2022-02-05 DIAGNOSIS — I6529 Occlusion and stenosis of unspecified carotid artery: Secondary | ICD-10-CM

## 2022-02-05 DIAGNOSIS — I2510 Atherosclerotic heart disease of native coronary artery without angina pectoris: Secondary | ICD-10-CM | POA: Insufficient documentation

## 2022-02-05 DIAGNOSIS — R072 Precordial pain: Secondary | ICD-10-CM | POA: Diagnosis not present

## 2022-02-05 LAB — BASIC METABOLIC PANEL
BUN/Creatinine Ratio: 12 (ref 9–23)
BUN: 11 mg/dL (ref 6–24)
CO2: 27 mmol/L (ref 20–29)
Calcium: 9.9 mg/dL (ref 8.7–10.2)
Chloride: 105 mmol/L (ref 96–106)
Creatinine, Ser: 0.93 mg/dL (ref 0.57–1.00)
Glucose: 96 mg/dL (ref 70–99)
Potassium: 5.2 mmol/L (ref 3.5–5.2)
Sodium: 142 mmol/L (ref 134–144)
eGFR: 72 mL/min/{1.73_m2} (ref >59)

## 2022-02-05 MED ORDER — METOPROLOL TARTRATE 25 MG PO TABS: 25.0000 mg | ORAL_TABLET | Freq: Once | ORAL | 0 refills | Status: DC

## 2022-02-05 NOTE — Progress Notes (Unsigned)
Primary Care Provider: Eilene Ghazi, NP [facility] Cardiologist: Glenetta Hew, MD Electrophysiologist: None  Clinic Note: No chief complaint on file.   ===================================  ASSESSMENT/PLAN   Problem List Items Addressed This Visit       Cardiology Problems   Carotid artery calcification (Chronic)   Coronary artery calcification seen on CAT scan (Chronic)    ===================================  HPI:    Kathryn Mason is a 56 y.o. female who is being seen today for the evaluation of Carotid and Aortic & Coronary atherosclerosis seen on CT- at the request of Eilene Ghazi, NP.  Kathryn Mason was last seen on October 05, 2021 by Eilene Ghazi, NP for routine preventative physical lamination. Lots of stress.  Noting hip and shoulder pain.Marland Kitchen  Recent Hospitalizations: ***  Reviewed  CV studies:    The following studies were reviewed today: (if available, images/films reviewed: From Epic Chart or Care Everywhere) ***:   Interval History:   Kathryn Mason   CV Review of Symptoms (Summary) Cardiovascular ROS: {roscv:310661}  REVIEWED OF SYSTEMS   ROS  I have reviewed and (if needed) personally updated the patient's problem list, medications, allergies, past medical and surgical history, social and family history.   PAST MEDICAL HISTORY   Past Medical History:  Diagnosis Date   Cervical cancer ([place]) 2005   GERD (gastroesophageal reflux disease)    Hx of migraines    Tubal pregnancy     PAST SURGICAL HISTORY   Past Surgical History:  Procedure Laterality Date   ABDOMINAL HYSTERECTOMY  2005   [place]    Immunization History  Administered Date(s) Administered   Tdap 10/11/2013    MEDICATIONS/ALLERGIES   Current Meds  Medication Sig   calcium carbonate (TUMS - DOSED IN MG ELEMENTAL CALCIUM) 500 MG chewable tablet Chew 1 tablet by mouth daily.   cycloSPORINE (RESTASIS) 0.05 % ophthalmic  emulsion INSTILL 1 DROP TWICE DAILY INTO EACH EYE   ibuprofen (ADVIL,MOTRIN) 200 MG tablet Take 600 mg by mouth as needed.   Magnesium 200 MG TABS Take 400 mg by mouth daily after breakfast.   Multiple Vitamin (MULTIVITAMIN) capsule Take 1 capsule by mouth daily.   OMEGA 3-6-9 FATTY ACIDS PO Take by mouth.   TURMERIC CURCUMIN PO Take by mouth.    No Known Allergies  SOCIAL HISTORY/FAMILY HISTORY   Reviewed in Epic:   Social History   Tobacco Use   Smoking status: Every Day    Packs/day: 0.50    Years: 30.00    Total pack years: 15.00    Types: Cigarettes    Start date: 11/02/1983   Smokeless tobacco: Never  Substance Use Topics   Alcohol use: Yes    Alcohol/week: 0.0 standard drinks of alcohol    Comment: rare   Drug use: No   Social History   Social History Narrative   Widowed as of 2014   Family History  Problem Relation Age of Onset   Heart disease Mother    Heart disease Father    Hypertension Father    Cancer Father        leukemia   Cancer Brother        brain    OBJCTIVE -PE, EKG, labs   Wt Readings from Last 3 Encounters:  02/05/22 272 lb 3.2 oz (123.5 kg)  07/02/18 270 lb 3.2 oz (122.6 kg)  08/21/16 277 lb (125.6 kg)    Physical Exam: BP 124/72  Pulse 60   Resp 20   Ht '5\' 9"'$  (1.753 m)   Wt 272 lb 3.2 oz (123.5 kg)   SpO2 96%   BMI 40.20 kg/m  Physical Exam   Adult ECG Report  Rate: *** ;  Rhythm: {rhythm:17366};   Narrative Interpretation: ***  Recent Labs: 10/05/2021     Lab Results  Component Value Date   HGBA1C 6.1 09/22/2013   Lab Results  Component Value Date   TSH 0.84 11/22/2014    ================================================== I spent a total of *** minutes with the patient spent in direct patient consultation.  Additional time spent with chart review  / charting (studies, outside notes, etc): *** min Total Time: *** min  Current medicines are reviewed at length with the patient today.  (+/- concerns)  ***  Notice: This dictation was prepared with Dragon dictation along with smart phrase technology. Any transcriptional errors that result from this process are unintentional and may not be corrected upon review.   Studies Ordered:  No orders of the defined types were placed in this encounter.  No orders of the defined types were placed in this encounter.   Patient Instructions / Medication Changes & Studies & Tests Ordered   There are no Patient Instructions on file for this visit.    Leonie Man, MD, MS Glenetta Hew, M.D., M.S. Interventional Cardiologist  [place]  Pager # [phone] Phone # [phone] [address] [place], [place] [address]   Thank you for choosing [facility]!!

## 2022-02-05 NOTE — Patient Instructions (Addendum)
Medication Instructions:   See instruction below   *If you need a refill on your cardiac medications before your next appointment, please call your pharmacy*   Lab Work: See instruction  below  If you have labs (blood work) drawn today and your tests are completely normal, you will receive your results only by: [place] (if you have MyChart) OR A paper copy in the mail If you have any lab test that is abnormal or we need to change your treatment, we will call you to review the results.   Testing/Procedures:  Will be schedule at [place] has requested that you have a carotid duplex. This test is an ultrasound of the carotid arteries in your neck. It looks at blood flow through these arteries that supply the brain with blood. Allow one hour for this exam. There are no restrictions or special instructions.   Will be schedule at [facility] at  the Radiology department Your physician has requested that you have coronary  CTA. Coronary  computed tomography (CT)A is a painless test that uses an x-ray machine to take clear, detailed pictures of your heart arteries .Please follow instruction sheet as given.    Follow-Up: At [facility], you and your health needs are our priority.  As part of our continuing mission to provide you with exceptional heart care, we have created designated Provider Care Teams.  These Care Teams include your primary Cardiologist (physician) and Advanced Practice Providers (APPs -  Physician Assistants and Nurse Practitioners) who all work together to provide you with the care you need, when you need it.     Your next appointment:   3 month(s)  The format for your next appointment:   In Person  Provider:   Glenetta Hew, MD    Other Instructions    Your cardiac CT will be scheduled at the below location:   [facility] [address] [place], [place] [address] [phone]    If scheduled at [facility], please arrive at the [facility] and Children's Entrance (Entrance C2) of [facility] 30 minutes prior to test start time. You can use the FREE valet parking offered at entrance C (encouraged to control the heart rate for the test)  Proceed to the [facility] Radiology Department (first floor) to check-in and test prep.  All radiology patients and guests should use entrance C2 at [facility], accessed from [facility], even though the hospital's physical address listed is [address].      Please follow these instructions carefully (unless otherwise directed):  Please have labs completed at lease a week prior to test  On the Night Before the Test: Be sure to Drink plenty of water. Do not consume any caffeinated/decaffeinated beverages or chocolate 12 hours prior to your test. Do not take any antihistamines 12 hours prior to your test.   On the Day of the Test: Drink plenty of water until 1 hour prior to the test. Do not eat any food 4 hours prior to the test. You may take your regular medications prior to the test.  Take metoprolol (Lopressor)  25 mg two hours prior to test. FEMALES- please wear underwire-free bra if available, avoid dresses & tight clothing        After the Test: Drink plenty of water. After receiving IV contrast, you may experience a mild flushed feeling. This is normal. On  occasion, you may experience a mild rash up to 24 hours after the test. This is not dangerous. If this occurs, you can take Benadryl 25 mg and increase your fluid intake. If you experience trouble breathing, this can be serious. If it is severe call 911 IMMEDIATELY. If it is mild, please call our office.   We will call to schedule your test 2-4 weeks out understanding that some insurance companies will need an authorization prior to the service being performed.   For non-scheduling related questions,  please contact the cardiac imaging nurse navigator should you have any questions/concerns: Marchia Bond, Cardiac Imaging Nurse Navigator Gordy Clement, Cardiac Imaging Nurse Navigator [facility] Direct Office Dial: [phone]   For scheduling needs, including cancellations and rescheduling, please call [place], [phone].

## 2022-02-06 ENCOUNTER — Encounter: Payer: Self-pay | Admitting: Cardiology

## 2022-02-06 DIAGNOSIS — R072 Precordial pain: Secondary | ICD-10-CM | POA: Insufficient documentation

## 2022-02-06 NOTE — Assessment & Plan Note (Addendum)
Somewhat atypical sounding symptoms.  She has discomfort at at rest and not so much with walking, she does have exertional dyspnea.  She does not do much walking which could be part of the issue as well.  It is possible that we are not capturing exertional chest discomfort.  With three-vessel coronary calcification seen on CT scan, we discussed baseline Coronary Calcium Score versus additional evaluation with Coronary CTA which allows anatomic and physiologic evaluation of her coronary disease.  This will give [place] full information as to potential causes discomfort but would also give her freedom to move forward with exercise if the test is negative for ischemic CAD.  We will also determine how aggressive we need to be admitted management.Marland Kitchen

## 2022-02-06 NOTE — Assessment & Plan Note (Addendum)
She has cut down to 1 to 3 cigarettes a day after smoking a pack a day.  She is really trying to quit.  She says that her coughing and symptoms have notably improved.  Her exertional dyspnea is also much better.  I congratulated her on her ongoing efforts and pressure to continue to work on quitting.  She is currently using nicotine lozenges as well as Nicorette gum.  She does not want to use patches because it may irritate her skin.

## 2022-02-06 NOTE — Assessment & Plan Note (Signed)
Question carotid calcification as well.  I do not hear bruits but as part of screening evaluation we will assess for any stenotic disease with carotid Dopplers.  This is also for basic risk ratification and screening.

## 2022-02-06 NOTE — Assessment & Plan Note (Signed)
Three-vessel coronary calcification seen on CT scan. We discussed Coronary Calcium Score evaluation versus proceeding with coronary CTA given her exertional dyspnea and chest pain symptoms. She would also like to start trying to exercise and in order to help her feel comfortable doing this, I think it is more prudent to exclude an obstructive CAD.  Plan: Check coronary CT angiogram -> this allows for anatomic evaluation along with the potential for his ischemic physiologic evaluation.  We discussed the importance of lipid modification based on results.  We will get a coronary Score as well as plaque evaluation.  Depending on results we may want to be more aggressive with her lipid management.  I suspect that we would probably consider initiating modest dose rosuvastatin.

## 2022-02-08 ENCOUNTER — Ambulatory Visit
Admission: RE | Admit: 2022-02-08 | Discharge: 2022-02-08 | Disposition: A | Discharge status: Home / Self Care | Payer: 59 | Source: Ambulatory Visit | Attending: Home Modifications | Admitting: Home Modifications

## 2022-02-08 DIAGNOSIS — R918 Other nonspecific abnormal finding of lung field: Secondary | ICD-10-CM

## 2022-02-18 ENCOUNTER — Ambulatory Visit (HOSPITAL_COMMUNITY)
Admission: RE | Admit: 2022-02-18 | Discharge: 2022-02-18 | Disposition: A | Discharge status: Home / Self Care | Payer: 59 | Source: Ambulatory Visit | Attending: Cardiology | Admitting: Cardiology

## 2022-02-18 DIAGNOSIS — I6529 Occlusion and stenosis of unspecified carotid artery: Secondary | ICD-10-CM | POA: Insufficient documentation

## 2022-02-18 DIAGNOSIS — I2510 Atherosclerotic heart disease of native coronary artery without angina pectoris: Secondary | ICD-10-CM | POA: Diagnosis present

## 2022-02-25 ENCOUNTER — Ambulatory Visit (HOSPITAL_COMMUNITY): Payer: 59

## 2022-05-02 ENCOUNTER — Ambulatory Visit (INDEPENDENT_AMBULATORY_CARE_PROVIDER_SITE_OTHER): Payer: 59 | Admitting: Nurse Practitioner

## 2022-05-02 ENCOUNTER — Encounter: Payer: Self-pay | Admitting: Nurse Practitioner

## 2022-05-02 VITALS — BP 110/72 | HR 64 | Ht 69.0 in | Wt 267.8 lb

## 2022-05-02 DIAGNOSIS — G4733 Obstructive sleep apnea (adult) (pediatric): Secondary | ICD-10-CM | POA: Diagnosis not present

## 2022-05-02 NOTE — Assessment & Plan Note (Signed)
She has a history of severe obstructive sleep apnea with AHI 49/h.  Originally diagnosed in 2015.  She has excellent compliance with CPAP and continues to receive benefit from use.  Residual AHI is 1.4 on current settings of 5-15.  She is using 14.4 cm water on average.  Her machine is old and she would like to get a new one.  We will also adjust her pressures to 8-18 cmH2O. cautioned on safe driving practices.  Patient Instructions  Continue to use CPAP every night, minimum of 4-6 hours a night.  Change equipment every 30 days or as directed by DME. Wash your tubing with warm soap and water daily, hang to dry. Wash humidifier portion weekly.  Be aware of reduced alertness and do not drive or operate heavy machinery if experiencing this or drowsiness.  Exercise encouraged, as tolerated. Notify if persistent daytime sleepiness occurs even with consistent use of CPAP.  Adjust pressure settings to 8-18 cmH2O   Follow up in 12 weeks with Kathryn Mindy Behnken,NP to check compliance on new machine or sooner if needed. If you haven't received your new machine yet or haven't been on it for at least 30 days, please call to reschedule

## 2022-05-02 NOTE — Progress Notes (Signed)
$'@Patient'l$  ID: Kathryn Mason, female    DOB: December 19, 1965, 56 y.o.   MRN: [id number]  Chief Complaint  Patient presents with   Consult    Pt consult she is still using the machine consecutively. She is needing a new machine and establishment w/ another provider     Referring provider: Eilene Ghazi, NP  HPI: 56 year old female, active smoker referred for sleep consult.  She was previously seen in our office for sleep apnea and followed by Dr. Gwenette Greet then Dr. Elsworth Soho.  Last seen in 2019 by Nils Pyle, NP.  Past medical history significant for CAD, GERD, obesity.  TEST/EVENTS:  12/2013 HST: AHI 49/h  05/02/2022: Today-sleep consult Patient presents today for sleep consult and to reestablish care for management of her obstructive sleep apnea.  She is currently on CPAP therapy and has been since 2015.  She is wearing it every single night.  Feels like she cannot sleep without it.  Has good control of her symptoms.  No excessive daytime fatigue symptoms or snoring. No drowsy driving.  Wakes feeling rested. Goes to bed between 11 PM and 1 AM.  Falls asleep within 15 to 20 minutes.  Wakes usually once a night.  Officially gets out of the bed in the morning around 7:30 AM.  She had lost about 50 pounds but then gained it back.  No significant cardiac history no history of stroke. She is an active smoker.  Smokes 5 cigarettes a day.  Currently trying to quit.  Lives at home alone.  Widowed.  Works as an Glass blower/designer.  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  Family history of mother and father with heart disease and father with cancer.  [place] 9  No Known Allergies  Immunization History  Administered Date(s) Administered   Tdap 10/11/2013    Past Medical History:  Diagnosis Date   Cervical cancer ([place]) 07/09/2003   Status post hysterectomy   GERD (gastroesophageal reflux disease)    Hx of migraines    OSA and COPD overlap syndrome (HCC)    OSA on CPAP; CT scan centrilobular  emphysema   Tubal pregnancy     Tobacco History: Social History   Tobacco Use  Smoking Status Every Day   Packs/day: 0.25   Years: 30.00   Total pack years: 7.50   Types: Cigarettes   Start date: 11/02/1983  Smokeless Tobacco Never  Tobacco Comments   Pt reports she is only smoking 5 cigarettes pd   Ready to quit: Not Answered Counseling given: Not Answered Tobacco comments: Pt reports she is only smoking 5 cigarettes pd   Outpatient Medications Prior to Visit  Medication Sig Dispense Refill   calcium carbonate (TUMS - DOSED IN MG ELEMENTAL CALCIUM) 500 MG chewable tablet Chew 1 tablet by mouth daily.     cycloSPORINE (RESTASIS) 0.05 % ophthalmic emulsion INSTILL 1 DROP TWICE DAILY INTO EACH EYE     ibuprofen (ADVIL,MOTRIN) 200 MG tablet Take 600 mg by mouth as needed.     Magnesium 200 MG TABS Take 400 mg by mouth daily after breakfast.     Multiple Vitamin (MULTIVITAMIN) capsule Take 1 capsule by mouth daily.     OMEGA 3-6-9 FATTY ACIDS PO Take by mouth.     TURMERIC CURCUMIN PO Take by mouth.     metoprolol tartrate (LOPRESSOR) 25 MG tablet Take 1 tablet (25 mg total) by mouth once for 1 dose. TAKE TWO HOURS PRIOR TO  SCHEDULE CARDIAC TEST 1  tablet 0   No facility-administered medications prior to visit.     Review of Systems:   Constitutional: No weight loss or gain, night sweats, fevers, chills, fatigue, or lassitude. HEENT: No headaches, difficulty swallowing, tooth/dental problems, or sore throat. No sneezing, itching, ear ache, nasal congestion, or post nasal drip CV:  No chest pain, orthopnea, PND, swelling in lower extremities, anasarca, dizziness, palpitations, syncope Resp: No shortness of breath with exertion or at rest. No excess mucus or change in color of mucus. No productive or non-productive. No hemoptysis. No wheezing.  No chest wall deformity GI:  +occasional heartburn, indigestion. No abdominal pain, nausea, vomiting, diarrhea, change in bowel habits,  loss of appetite, bloody stools.  GU: No dysuria, change in color of urine, urgency or frequency.  No flank pain, no hematuria  Skin: No rash, lesions, ulcerations MSK:  No joint pain or swelling.  No decreased range of motion.  No back pain. Neuro: No dizziness or lightheadedness.  Psych: No depression or anxiety. Mood stable.     Physical Exam:  BP 110/72   Pulse 64   Ht '5\' 9"'$  (1.753 m)   Wt 267 lb 12.8 oz (121.5 kg)   SpO2 96%   BMI 39.55 kg/m   GEN: Pleasant, interactive, well-appearing; obese; in no acute distress. HEENT:  Normocephalic and atraumatic. PERRLA. Sclera white. Nasal turbinates pink, moist and patent bilaterally. No rhinorrhea present. Oropharynx pink and moist, without exudate or edema. No lesions, ulcerations, or postnasal drip. Mallampati II NECK:  Supple w/ fair ROM. No JVD present. Normal carotid impulses w/o bruits. Thyroid symmetrical with no goiter or nodules palpated. No lymphadenopathy.   CV: RRR, no m/r/g, no peripheral edema. Pulses intact, +2 bilaterally. No cyanosis, pallor or clubbing. PULMONARY:  Unlabored, regular breathing. Clear bilaterally A&P w/o wheezes/rales/rhonchi. No accessory muscle use.  GI: BS present and normoactive. Soft, non-tender to palpation. No organomegaly or masses detected.  MSK: No erythema, warmth or tenderness. Cap refil <2 sec all extrem. No deformities or joint swelling noted.  Neuro: A/Ox3. No focal deficits noted.   Skin: Warm, no lesions or rashe Psych: Normal affect and behavior. Judgement and thought content appropriate.     Lab Results:  CBC    Component Value Date/Time   WBC 6.7 11/16/2014 1700   RBC 4.09 11/16/2014 1700   HGB 12.2 11/16/2014 1700   HCT 36.5 11/16/2014 1700   PLT 183.0 11/16/2014 1700   MCV 89.3 11/16/2014 1700   MCHC 33.6 11/16/2014 1700   RDW 14.3 11/16/2014 1700    BMET    Component Value Date/Time   NA 142 02/05/2022 0930   K 5.2 02/05/2022 0930   CL 105 02/05/2022 0930   CO2  27 02/05/2022 0930   GLUCOSE 96 02/05/2022 0930   GLUCOSE 81 11/16/2014 1700   BUN 11 02/05/2022 0930   CREATININE 0.93 02/05/2022 0930   CALCIUM 9.9 02/05/2022 0930    BNP No results found for: "BNP"   Imaging:  No results found.        No data to display          No results found for: "NITRICOXIDE"      Assessment & Plan:   OSA (obstructive sleep apnea) She has a history of severe obstructive sleep apnea with AHI 49/h.  Originally diagnosed in 2015.  She has excellent compliance with CPAP and continues to receive benefit from use.  Residual AHI is 1.4 on current settings of 5-15.  She is using  14.4 cm water on average.  Her machine is old and she would like to get a new one.  We will also adjust her pressures to 8-18 cmH2O. cautioned on safe driving practices.  Patient Instructions  Continue to use CPAP every night, minimum of 4-6 hours a night.  Change equipment every 30 days or as directed by DME. Wash your tubing with warm soap and water daily, hang to dry. Wash humidifier portion weekly.  Be aware of reduced alertness and do not drive or operate heavy machinery if experiencing this or drowsiness.  Exercise encouraged, as tolerated. Notify if persistent daytime sleepiness occurs even with consistent use of CPAP.  Adjust pressure settings to 8-18 cmH2O   Follow up in 12 weeks with Katie Caitlain Tweed,NP to check compliance on new machine or sooner if needed. If you haven't received your new machine yet or haven't been on it for at least 30 days, please call to reschedule    I spent 38 minutes of dedicated to the care of this patient on the date of this encounter to include pre-visit review of records, face-to-face time with the patient discussing conditions above, post visit ordering of testing, clinical documentation with the electronic health record, making appropriate referrals as documented, and communicating necessary findings to members of the patients care  team.  Clayton Bibles, NP 05/02/2022  Pt aware and understands NP's role.

## 2022-05-02 NOTE — Patient Instructions (Signed)
Continue to use CPAP every night, minimum of 4-6 hours a night.  Change equipment every 30 days or as directed by DME. Wash your tubing with warm soap and water daily, hang to dry. Wash humidifier portion weekly.  Be aware of reduced alertness and do not drive or operate heavy machinery if experiencing this or drowsiness.  Exercise encouraged, as tolerated. Notify if persistent daytime sleepiness occurs even with consistent use of CPAP.  Adjust pressure settings to 8-18 cmH2O   Follow up in 12 weeks with Katie Mychelle Kendra,NP to check compliance on new machine or sooner if needed. If you haven't received your new machine yet or haven't been on it for at least 30 days, please call to reschedule

## 2022-05-03 NOTE — Progress Notes (Signed)
Reviewed and agree with assessment/plan.   Chesley Mires, MD [facility] Pulmonary/Critical Care 05/03/2022, 8:30 AM Pager:  [phone]

## 2022-05-13 ENCOUNTER — Ambulatory Visit: Payer: 59 | Admitting: Cardiology

## 2022-08-02 ENCOUNTER — Ambulatory Visit: Payer: 59 | Admitting: Nurse Practitioner

## 2022-08-05 ENCOUNTER — Ambulatory Visit: Payer: 59 | Admitting: Nurse Practitioner

## 2022-08-09 ENCOUNTER — Ambulatory Visit (INDEPENDENT_AMBULATORY_CARE_PROVIDER_SITE_OTHER): Payer: 59 | Admitting: Nurse Practitioner

## 2022-08-09 ENCOUNTER — Encounter: Payer: Self-pay | Admitting: Nurse Practitioner

## 2022-08-09 VITALS — BP 110/60 | HR 72 | Temp 98.4°F | Ht 69.0 in | Wt 273.4 lb

## 2022-08-09 DIAGNOSIS — G4733 Obstructive sleep apnea (adult) (pediatric): Secondary | ICD-10-CM

## 2022-08-09 NOTE — Assessment & Plan Note (Signed)
Severe OSA on CPAP therapy.  She has excellent control and compliance with this.  She does feel like with the nasal mask, her pressures are set too high.  She would like to make some adjustments to see if this helps.  We will adjust her to 8 to 15 cm of water.  Her average pressure is 15.2 on her download so I think this will work well for her.  Advised her to notify us if she continues to have trouble we can make some further adjustments.  Otherwise, she continues to receive good benefit from use.  Safe driving practices reviewed.  Patient Instructions  Continue to use CPAP every night, minimum of 4-6 hours a night.  Change equipment every 30 days or as directed by DME. Wash your tubing with warm soap and water daily, hang to dry. Wash humidifier portion weekly.  Be aware of reduced alertness and do not drive or operate heavy machinery if experiencing this or drowsiness.  Exercise encouraged, as tolerated. Notify if persistent daytime sleepiness occurs even with consistent use of CPAP.   Adjust pressure settings back to 8-15 cmH2O    Follow up in 6 months with Dr. Ander Slade (new pt 30 min slot) or Katie Shigeo Baugh,NP, or sooner, if needed

## 2022-08-09 NOTE — Progress Notes (Signed)
$'@Patient'R$  ID: Kathryn Mason, female    DOB: June 13, 1966, 57 y.o.   MRN: [id number]  Chief Complaint  Patient presents with   Follow-up    Pt states no new issues since LOV.    Referring provider: Eilene Ghazi, NP  HPI: 57 year old female, active smoker followed for severe OSA on CPAP.  She was previously seen in our office for sleep apnea and followed by Dr. Gwenette Mason then Dr. Elsworth Mason.  Last seen in 2019 by Kathryn Pyle, NP.  Past medical history significant for CAD, GERD, obesity, OSA on CPAP.  TEST/EVENTS:  12/2013 HST: AHI 49/h  05/02/2022: OV with Kathryn Bolio NP for sleep consult and to reestablish care for management of her obstructive sleep apnea.  She is currently on CPAP therapy and has been since 2015.  She is wearing it every single night.  Feels like she cannot sleep without it.  Has good control of her symptoms.  No excessive daytime fatigue symptoms or snoring. No drowsy driving.  Wakes feeling rested. Goes to bed between 11 PM and 1 AM.  Falls asleep within 15 to 20 minutes.  Wakes usually once a night.  Officially gets out of the bed in the morning around 7:30 AM.  She had lost about 50 pounds but then gained it back.  No significant cardiac history no history of stroke. She is an active smoker.  Smokes 5 cigarettes a day.  Currently trying to quit.  Lives at home alone.  Widowed.  Works as an Glass blower/designer.  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  Family history of mother and father with heart disease and father with cancer. [facility] 9 Orders for new machine. Change settings to 8-18 cmH2O given average use of 15 cmH2O.  08/09/2022: Today - follow up Patient presents today for follow-up after receiving new CPAP machine.  We also adjusted her pressures to 8-18 cmH2O.  She had gotten a nasal pillow mask, which she really liked but she felt like her pressures were too high with this.  Because of this, she has gone back to her old mask.  Not currently having any issues but would  like to try the other mask again.  Feels like she sleeps well with her CPAP.  Wakes up feeling rested.  Does not have any significant daytime symptoms.  Denies any drowsy driving or morning headaches.   07/09/2022-08/07/2022 CPAP 8-18 cmH2O 30/30 days; 97% greater than 4 hours; average use 7 hours 38 minutes Pressure 95th 15.2 Leaks 95th 34.9 AHI 2.1  No Known Allergies  Immunization History  Administered Date(s) Administered   Tdap 10/11/2013    Past Medical History:  Diagnosis Date   Cervical cancer ([place]) 07/09/2003   Status post hysterectomy   GERD (gastroesophageal reflux disease)    Hx of migraines    OSA and COPD overlap syndrome (HCC)    OSA on CPAP; CT scan centrilobular emphysema   Tubal pregnancy     Tobacco History: Social History   Tobacco Use  Smoking Status Every Day   Packs/day: 0.25   Years: 30.00   Total pack years: 7.50   Types: Cigarettes   Start date: 11/02/1983  Smokeless Tobacco Never  Tobacco Comments   Pt reports she is only smoking 5 cigarettes pd as of 08/09/2022 Kathryn Mason, Kathryn Mason   Ready to quit: Not Answered Counseling given: Not Answered Tobacco comments: Pt reports she is only smoking 5 cigarettes pd as of 08/09/2022 Kathryn Mason, Kathryn Mason   Outpatient  Medications Prior to Visit  Medication Sig Dispense Refill   calcium carbonate (TUMS - DOSED IN MG ELEMENTAL CALCIUM) 500 MG chewable tablet Chew 1 tablet by mouth daily.     cycloSPORINE (RESTASIS) 0.05 % ophthalmic emulsion INSTILL 1 DROP TWICE DAILY INTO EACH EYE     ibuprofen (ADVIL,MOTRIN) 200 MG tablet Take 600 mg by mouth as needed.     Magnesium 200 MG TABS Take 400 mg by mouth daily after breakfast.     metoprolol tartrate (LOPRESSOR) 25 MG tablet Take 1 tablet (25 mg total) by mouth once for 1 dose. TAKE TWO HOURS PRIOR TO  SCHEDULE CARDIAC TEST 1 tablet 0   Multiple Vitamin (MULTIVITAMIN) capsule Take 1 capsule by mouth daily.     OMEGA 3-6-9 FATTY ACIDS PO Take by mouth.      TURMERIC CURCUMIN PO Take by mouth.     No facility-administered medications prior to visit.     Review of Systems:   Constitutional: No weight loss or gain, night sweats, fevers, chills, fatigue, or lassitude. HEENT: No headaches, difficulty swallowing, tooth/dental problems, or sore throat. No sneezing, itching, ear ache, nasal congestion, or post nasal drip CV:  No chest pain, orthopnea, PND, swelling in lower extremities, anasarca, dizziness, palpitations, syncope Resp: No shortness of breath with exertion or at rest. No excess mucus or change in color of mucus. No productive or non-productive. No hemoptysis. No wheezing.  No chest wall deformity GI:  +occasional heartburn, indigestion. No abdominal pain, nausea, vomiting, diarrhea, change in bowel habits, loss of appetite, bloody stools.  GU: No dysuria, change in color of urine, urgency or frequency.  No flank pain, no hematuria  Skin: No rash, lesions, ulcerations MSK:  No joint pain or swelling.  No decreased range of motion.  No back pain. Neuro: No dizziness or lightheadedness.  Psych: No depression or anxiety. Mood stable.     Physical Exam:  BP 110/60 (BP Location: Right Arm, Patient Position: Sitting, Cuff Size: Large)   Pulse 72   Temp 98.4 F (36.9 C) (Oral)   Ht '5\' 9"'$  (1.753 m)   Wt 273 lb 6.4 oz (124 kg)   SpO2 98%   BMI 40.37 kg/m   GEN: Pleasant, interactive, well-appearing; obese; in no acute distress. HEENT:  Normocephalic and atraumatic. PERRLA. Sclera white. Nasal turbinates pink, moist and patent bilaterally. No rhinorrhea present. Oropharynx pink and moist, without exudate or edema. No lesions, ulcerations, or postnasal drip. Mallampati II NECK:  Supple w/ fair ROM. No JVD present. Normal carotid impulses w/o bruits. Thyroid symmetrical with no goiter or nodules palpated. No lymphadenopathy.   CV: RRR, no m/r/g, no peripheral edema. Pulses intact, +2 bilaterally. No cyanosis, pallor or  clubbing. PULMONARY:  Unlabored, regular breathing. Clear bilaterally A&P w/o wheezes/rales/rhonchi. No accessory muscle use.  GI: BS present and normoactive. Soft, non-tender to palpation. No organomegaly or masses detected.  MSK: No erythema, warmth or tenderness. Cap refil <2 sec all extrem. No deformities or joint swelling noted.  Neuro: A/Ox3. No focal deficits noted.   Skin: Warm, no lesions or rashe Psych: Normal affect and behavior. Judgement and thought content appropriate.     Lab Results:  CBC    Component Value Date/Time   WBC 6.7 11/16/2014 1700   RBC 4.09 11/16/2014 1700   HGB 12.2 11/16/2014 1700   HCT 36.5 11/16/2014 1700   PLT 183.0 11/16/2014 1700   MCV 89.3 11/16/2014 1700   MCHC 33.6 11/16/2014 1700   RDW 14.3  11/16/2014 1700    BMET    Component Value Date/Time   NA 142 02/05/2022 0930   K 5.2 02/05/2022 0930   CL 105 02/05/2022 0930   CO2 27 02/05/2022 0930   GLUCOSE 96 02/05/2022 0930   GLUCOSE 81 11/16/2014 1700   BUN 11 02/05/2022 0930   CREATININE 0.93 02/05/2022 0930   CALCIUM 9.9 02/05/2022 0930    BNP No results found for: "BNP"   Imaging:  No results found.        No data to display          No results found for: "NITRICOXIDE"      Assessment & Plan:   OSA (obstructive sleep apnea) Severe OSA on CPAP therapy.  She has excellent control and compliance with this.  She does feel like with the nasal mask, her pressures are set too high.  She would like to make some adjustments to see if this helps.  We will adjust her to 8 to 15 cm of water.  Her average pressure is 15.2 on her download so I think this will work well for her.  Advised her to notify us if she continues to have trouble we can make some further adjustments.  Otherwise, she continues to receive good benefit from use.  Safe driving practices reviewed.  Patient Instructions  Continue to use CPAP every night, minimum of 4-6 hours a night.  Change equipment every  30 days or as directed by DME. Wash your tubing with warm soap and water daily, hang to dry. Wash humidifier portion weekly.  Be aware of reduced alertness and do not drive or operate heavy machinery if experiencing this or drowsiness.  Exercise encouraged, as tolerated. Notify if persistent daytime sleepiness occurs even with consistent use of CPAP.   Adjust pressure settings back to 8-15 cmH2O    Follow up in 6 months with Dr. Ander Slade (new pt 30 min slot) or Katie Brizeyda Holtmeyer,NP, or sooner, if needed    Morbid obesity ([place]) BMI 40.3.  Healthy weight loss encouraged.   I spent 28 minutes of dedicated to the care of this patient on the date of this encounter to include pre-visit review of records, face-to-face time with the patient discussing conditions above, post visit ordering of testing, clinical documentation with the electronic health record, making appropriate referrals as documented, and communicating necessary findings to members of the patients care team.  Clayton Bibles, NP 08/09/2022  Pt aware and understands NP's role.

## 2022-08-09 NOTE — Assessment & Plan Note (Signed)
BMI 40.3.  Healthy weight loss encouraged.

## 2022-08-09 NOTE — Patient Instructions (Addendum)
Continue to use CPAP every night, minimum of 4-6 hours a night.  Change equipment every 30 days or as directed by DME. Wash your tubing with warm soap and water daily, hang to dry. Wash humidifier portion weekly.  Be aware of reduced alertness and do not drive or operate heavy machinery if experiencing this or drowsiness.  Exercise encouraged, as tolerated. Notify if persistent daytime sleepiness occurs even with consistent use of CPAP.   Adjust pressure settings back to 8-15 cmH2O    Follow up in 6 months with Dr. Ander Slade (new pt 30 min slot) or Katie Rachid Parham,NP, or sooner, if needed

## 2022-10-26 ENCOUNTER — Ambulatory Visit
Admission: EM | Admit: 2022-10-26 | Discharge: 2022-10-26 | Disposition: A | Discharge status: Home / Self Care | Payer: 59 | Attending: Internal Medicine | Admitting: Internal Medicine

## 2022-10-26 ENCOUNTER — Ambulatory Visit (INDEPENDENT_AMBULATORY_CARE_PROVIDER_SITE_OTHER): Payer: 59

## 2022-10-26 DIAGNOSIS — R0781 Pleurodynia: Secondary | ICD-10-CM

## 2022-10-26 DIAGNOSIS — W19XXXA Unspecified fall, initial encounter: Secondary | ICD-10-CM | POA: Diagnosis not present

## 2022-10-26 NOTE — ED Triage Notes (Signed)
Pt c/o falling onto a metal bucket on Wed. Now "I'm having problems getting up and down" with right thoracic tenderness.

## 2022-10-26 NOTE — Discharge Instructions (Signed)
X-ray of ribs was normal.  Suspect rib bruising.  Apply ice and use deep breathing techniques as we discussed.  Follow-up with Ortho or primary care if symptoms persist or worsen.

## 2022-10-26 NOTE — ED Provider Notes (Signed)
[facility]    CSN: [id number] Arrival date & time: 10/26/22  4098      History   Chief Complaint Chief Complaint  Patient presents with   Fall    HPI Kathryn Mason is a 57 y.o. female.   Patient presents with right rib pain after a fall occurred about 4 days ago.  Patient reports that she was attempting to sit down on a stool when it moved and she fell sideways with her ribs landing on a bucket that was sitting beside her.  She denies hitting head or losing consciousness.  Has been taking ibuprofen for pain with temporary improvement.  Patient does not take any blood thinning medications.  Reports pain with inspiration and shortness of breath with exertion.   Fall    Past Medical History:  Diagnosis Date   Cervical cancer 07/09/2003   Status post hysterectomy   GERD (gastroesophageal reflux disease)    Hx of migraines    OSA and COPD overlap syndrome    OSA on CPAP; CT scan centrilobular emphysema   Tubal pregnancy     Patient Active Problem List   Diagnosis Date Noted   Precordial pain 02/06/2022   Coronary artery calcification seen on CAT scan 02/05/2022   Carotid artery calcification 02/05/2022   Acute cystitis 05/13/2016   Productive cough 05/13/2016   Tennis elbow 02/02/2014   Morbid obesity 09/22/2013   GERD (gastroesophageal reflux disease) 09/22/2013   OSA (obstructive sleep apnea) 09/22/2013   Menopausal symptoms 09/22/2013   Dry mouth 09/22/2013   Current every day smoker 09/22/2013    Past Surgical History:  Procedure Laterality Date   ABDOMINAL HYSTERECTOMY  2005   CHOLECYSTECTOMY  1987   TUBAL LIGATION  1992    OB History   No obstetric history on file.      Home Medications    Prior to Admission medications   Medication Sig Start Date End Date Taking? Authorizing Provider  calcium carbonate (TUMS - DOSED IN MG ELEMENTAL CALCIUM) 500 MG chewable tablet Chew 1 tablet by mouth daily.    [provider]   cycloSPORINE (RESTASIS) 0.05 % ophthalmic emulsion INSTILL 1 DROP TWICE DAILY INTO EACH EYE    [provider]  ibuprofen (ADVIL,MOTRIN) 200 MG tablet Take 600 mg by mouth as needed.    [provider]  Magnesium 200 MG TABS Take 400 mg by mouth daily after breakfast.    [provider]  metoprolol tartrate (LOPRESSOR) 25 MG tablet Take 1 tablet (25 mg total) by mouth once for 1 dose. TAKE TWO HOURS PRIOR TO  SCHEDULE CARDIAC TEST 02/05/22 08/09/22  Marykay Lex, MD  Multiple Vitamin (MULTIVITAMIN) capsule Take 1 capsule by mouth daily.    [provider]  OMEGA 3-6-9 FATTY ACIDS PO Take by mouth.    [provider]  TURMERIC CURCUMIN PO Take by mouth.    [provider]    Family History Family History  Problem Relation Age of Onset   Atrial fibrillation Mother    Heart disease Father        Unknown   Hypertension Father    Cancer Father        leukemia   Chronic Renal Failure Sister    Crohn's disease Sister    Crohn's disease Sister    Brain cancer Brother        brain   Heart Problems Brother        Unaware of details.  Heart failure Brother     Social History Social History   Tobacco Use   Smoking status: Every Day    Packs/day: 0.25    Years: 30.00    Additional pack years: 0.00    Total pack years: 7.50    Types: Cigarettes    Start date: 11/02/1983   Smokeless tobacco: Never   Tobacco comments:    Pt reports she is only smoking 5 cigarettes pd as of 08/09/2022 Almetta Lovely, CMA  Substance Use Topics   Alcohol use: Yes    Alcohol/week: 0.0 standard drinks of alcohol    Comment: rare   Drug use: No     Allergies   Patient has no known allergies.   Review of Systems Review of Systems Per HPI  Physical Exam Triage Vital Signs ED Triage Vitals  Enc Vitals Group     BP 10/26/22 0840 (!) 152/85     Pulse Rate 10/26/22 0840 75     Resp 10/26/22 0840 18     Temp 10/26/22 0840 98 F (36.7 C)      Temp Source 10/26/22 0840 Oral     SpO2 10/26/22 0840 95 %     Weight --      Height --      Head Circumference --      Peak Flow --      Pain Score 10/26/22 0841 3     Pain Loc --      Pain Edu? --      Excl. in GC? --    No data found.  Updated Vital Signs BP (!) 152/85 (BP Location: Right Arm)   Pulse 75   Temp 98 F (36.7 C) (Oral)   Resp 18   SpO2 95%   Visual Acuity Right Eye Distance:   Left Eye Distance:   Bilateral Distance:    Right Eye Near:   Left Eye Near:    Bilateral Near:     Physical Exam Constitutional:      General: She is not in acute distress.    Appearance: Normal appearance. She is not toxic-appearing or diaphoretic.  HENT:     Head: Normocephalic and atraumatic.  Eyes:     Extraocular Movements: Extraocular movements intact.     Conjunctiva/sclera: Conjunctivae normal.  Cardiovascular:     Rate and Rhythm: Normal rate and regular rhythm.     Pulses: Normal pulses.     Heart sounds: Normal heart sounds.  Pulmonary:     Effort: Pulmonary effort is normal. No respiratory distress.     Breath sounds: Normal breath sounds.  Chest:     Chest wall: Tenderness present.     Comments: Patient has bruising discoloration present to right posterior lower lateral ribs.  Patient has tenderness to palpation across this area that extends to the front lower lateral ribs.  No obvious lacerations or abrasions noted.  No crepitus noted to palpation. Neurological:     General: No focal deficit present.     Mental Status: She is alert and oriented to person, place, and time. Mental status is at baseline.  Psychiatric:        Mood and Affect: Mood normal.        Behavior: Behavior normal.        Thought Content: Thought content normal.        Judgment: Judgment normal.      UC Treatments / Results  Labs (all labs ordered are listed, but only abnormal results are  displayed) Labs Reviewed - No data to display  EKG   Radiology DG Ribs Unilateral  W/Chest Right  Result Date: 10/26/2022 CLINICAL DATA:  Fall, right rib pain EXAM: RIGHT RIBS AND CHEST - 3+ VIEW COMPARISON:  02/08/2022 FINDINGS: No displaced fracture or other bone lesions are seen involving the ribs. There is no evidence of pneumothorax or pleural effusion. Both lungs are clear. Heart size and mediastinal contours are within normal limits. IMPRESSION: Negative. Electronically Signed   By: Duanne Guess D.O.   On: 10/26/2022 09:14    Procedures Procedures (including critical care time)  Medications Ordered in UC Medications - No data to display  Initial Impression / Assessment and Plan / UC Course  I have reviewed the triage vital signs and the nursing notes.  Pertinent labs & imaging results that were available during my care of the patient were reviewed by me and considered in my medical decision making (see chart for details).     Rib x-ray was negative for any acute bony abnormality or cardiopulmonary process.  Most likely rib contusion given mechanism of injury.  Advised patient to use ice application to decrease inflammation and to ensure deep breathing to prevent pneumonia.  Patient was offered IM Toradol but declined stating that she wishes to take her ibuprofen that she has at home.  Advised strict follow-up if any symptoms persist or worsen and patient was provided with contact information for orthopedist if necessary.  Patient verbalized understanding and was agreeable with plan. Final Clinical Impressions(s) / UC Diagnoses   Final diagnoses:  Fall, initial encounter  Rib pain on right side     Discharge Instructions      X-ray of ribs was normal.  Suspect rib bruising.  Apply ice and use deep breathing techniques as we discussed.  Follow-up with Ortho or primary care if symptoms persist or worsen.     ED Prescriptions   None    I have reviewed the PDMP during this encounter.   Gustavus Bryant, [place] 10/26/22 [phone]

## 2022-12-10 ENCOUNTER — Telehealth: Payer: Self-pay | Admitting: *Deleted

## 2022-12-10 ENCOUNTER — Telehealth: Payer: Self-pay

## 2022-12-10 NOTE — Telephone Encounter (Signed)
  Patient Consent for Virtual Visit        Kathryn Mason has provided verbal consent on 12/10/2022 for a virtual visit (video or telephone).   CONSENT FOR VIRTUAL VISIT FOR:  Kathryn Mason  By participating in this virtual visit I agree to the following:  I hereby voluntarily request, consent and authorize [facility] and its employed or contracted physicians, physician assistants, nurse practitioners or other licensed health care professionals (the Practitioner), to provide me with telemedicine health care services (the "Services") as deemed necessary by the treating Practitioner. I acknowledge and consent to receive the Services by the Practitioner via telemedicine. I understand that the telemedicine visit will involve communicating with the Practitioner through live audiovisual communication technology and the disclosure of certain medical information by electronic transmission. I acknowledge that I have been given the opportunity to request an in-person assessment or other available alternative prior to the telemedicine visit and am voluntarily participating in the telemedicine visit.  I understand that I have the right to withhold or withdraw my consent to the use of telemedicine in the course of my care at any time, without affecting my right to future care or treatment, and that the Practitioner or I may terminate the telemedicine visit at any time. I understand that I have the right to inspect all information obtained and/or recorded in the course of the telemedicine visit and may receive copies of available information for a reasonable fee.  I understand that some of the potential risks of receiving the Services via telemedicine include:  Delay or interruption in medical evaluation due to technological equipment failure or disruption; Information transmitted may not be sufficient (e.g. poor resolution of images) to allow for appropriate medical decision making by the  Practitioner; and/or  In rare instances, security protocols could fail, causing a breach of personal health information.  Furthermore, I acknowledge that it is my responsibility to provide information about my medical history, conditions and care that is complete and accurate to the best of my ability. I acknowledge that Practitioner's advice, recommendations, and/or decision may be based on factors not within their control, such as incomplete or inaccurate data provided by me or distortions of diagnostic images or specimens that may result from electronic transmissions. I understand that the practice of medicine is not an exact science and that Practitioner makes no warranties or guarantees regarding treatment outcomes. I acknowledge that a copy of this consent can be made available to me via my patient portal [facility] MyChart), or I can request a printed copy by calling the office of  [facility].    I understand that my insurance will be billed for this visit.   I have read or had this consent read to me. I understand the contents of this consent, which adequately explains the benefits and risks of the Services being provided via telemedicine.  I have been provided ample opportunity to ask questions regarding this consent and the Services and have had my questions answered to my satisfaction. I give my informed consent for the services to be provided through the use of telemedicine in my medical care

## 2022-12-10 NOTE — Telephone Encounter (Signed)
Spoke with patient who is agreeable to do a tele visit on 6/17 at 3 pm. Med rec and consent done.

## 2022-12-10 NOTE — Telephone Encounter (Signed)
   Pre-operative Risk Assessment    Patient Name: Kathryn Mason  DOB: 08/01/1965 MRN: [id number]      Request for Surgical Clearance    Procedure:   Right Total Anterior hip arthroplasty  Date of Surgery:  Clearance TBD                                 Surgeon: Dr. Marcene Corning Surgeon's Group or Practice Name:  [facility] Phone number:  [phone] Fax number:  [phone]   Type of Clearance Requested:   - Medical    Type of Anesthesia:  Spinal   Additional requests/questions:    Signed, Emmit Pomfret   12/10/2022, 8:03 AM

## 2022-12-10 NOTE — Telephone Encounter (Signed)
   Name: Kathryn Mason  DOB: 1966/04/23  MRN: [id number]  Primary Cardiologist: Bryan Lemma, MD   Preoperative team, please contact this patient and set up a phone call appointment for further preoperative risk assessment. Please obtain consent and complete medication review. Thank you for your help.  I confirm that guidance regarding antiplatelet and oral anticoagulation therapy has been completed and, if necessary, noted below.  None requested.    Carlos Levering, NP 12/10/2022, 11:40 AM [facility]

## 2022-12-23 ENCOUNTER — Ambulatory Visit: Payer: 59 | Attending: Cardiovascular Disease | Admitting: Nurse Practitioner

## 2022-12-23 DIAGNOSIS — Z01810 Encounter for preprocedural cardiovascular examination: Secondary | ICD-10-CM | POA: Diagnosis not present

## 2022-12-23 NOTE — Progress Notes (Signed)
Virtual Visit via Telephone Note   Because of Kathryn Mason's co-morbid illnesses, she is at least at moderate risk for complications without adequate follow up.  This format is felt to be most appropriate for this patient at this time.  The patient did not have access to video technology/had technical difficulties with video requiring transitioning to audio format only (telephone).  All issues noted in this document were discussed and addressed.  No physical exam could be performed with this format.  Please refer to the patient's chart for her consent to telehealth for Kathryn Mason.  Evaluation Performed:  Preoperative cardiovascular risk assessment _____________   Date:  12/23/2022   Patient ID:  Kathryn Mason, DOB 1965/11/05, MRN [id number] Patient Location:  Home Provider location:   Office  Primary Care Provider:  Dennie Maizes, NP (Inactive) Primary Cardiologist:  Bryan Lemma, MD  Chief Complaint / Patient Profile   57 y.o. y/o female with a h/o coronary artery calcification noted on prior CT, GERD, COPD and tobacco use who is pending right total anterior hip arthroplasty with Dr. Marcene Corning of [facility] and presents today for telephonic preoperative cardiovascular risk assessment.  History of Present Illness    Kathryn Mason is a 57 y.o. female who presents via audio/video conferencing for a telehealth visit today.  Pt was last seen in cardiology clinic on 02/05/2022 by Dr. Herbie Baltimore.  At that time Kathryn Mason was doing well. She did note atypical chest discomfort.  Coronary CT angiogram was ordered but not completed. The patient is now pending procedure as outlined above. Since her last visit, she has done well from a cardiac standpoint.   She denies chest pain, palpitations, dyspnea, pnd, orthopnea, n, v, dizziness, syncope, edema, weight gain, or early satiety. All other systems reviewed and are otherwise negative except as noted above.   Past  Medical History    Past Medical History:  Diagnosis Date   Cervical cancer (HCC) 07/09/2003   Status post hysterectomy   GERD (gastroesophageal reflux disease)    Hx of migraines    OSA and COPD overlap syndrome (HCC)    OSA on CPAP; CT scan centrilobular emphysema   Tubal pregnancy    Past Surgical History:  Procedure Laterality Date   ABDOMINAL HYSTERECTOMY  2005   CHOLECYSTECTOMY  1987   TUBAL LIGATION  1992    Allergies  No Known Allergies  Home Medications    Prior to Admission medications   Medication Sig Start Date End Date Taking? Authorizing Provider  calcium carbonate (TUMS - DOSED IN MG ELEMENTAL CALCIUM) 500 MG chewable tablet Chew 1 tablet by mouth daily.    [provider]  cycloSPORINE (RESTASIS) 0.05 % ophthalmic emulsion INSTILL 1 DROP TWICE DAILY INTO EACH EYE    [provider]  ibuprofen (ADVIL,MOTRIN) 200 MG tablet Take 600 mg by mouth as needed.    [provider]  Magnesium 200 MG TABS Take 400 mg by mouth daily after breakfast.    [provider]  metoprolol tartrate (LOPRESSOR) 25 MG tablet Take 1 tablet (25 mg total) by mouth once for 1 dose. TAKE TWO HOURS PRIOR TO  SCHEDULE CARDIAC TEST 02/05/22 08/09/22  Marykay Lex, MD  Multiple Vitamin (MULTIVITAMIN) capsule Take 1 capsule by mouth daily.    [provider]  OMEGA 3-6-9 FATTY ACIDS PO Take by mouth.    [provider]  TURMERIC CURCUMIN PO Take by mouth.    [provider]    Physical Exam    Vital Signs:  [name] does not have vital signs available for review today.  Given telephonic nature of communication, physical exam is limited. AAOx3. NAD. Normal affect.  Speech and respirations are unlabored.  Accessory Clinical Findings    None  Assessment & Plan    1.  Preoperative Cardiovascular Risk Assessment:  According to the Revised Cardiac Risk Index (RCRI), her Perioperative Risk of Major Cardiac Event is (%):  0.4. Her Functional Capacity in METs is: 7.34 according to the Duke Activity Status Index (DASI).Therefore, based on ACC/AHA guidelines, patient would be at acceptable risk for the planned procedure without further cardiovascular testing.    The patient was advised that if she develops new symptoms prior to surgery to contact our office to arrange for a follow-up visit, and she verbalized understanding.   A copy of this note will be routed to requesting surgeon.  Time:   Today, I have spent 5 minutes with the patient with telehealth technology discussing medical history, symptoms, and management plan.     Joylene Grapes, NP  12/23/2022, 3:06 PM

## 2022-12-26 ENCOUNTER — Telehealth: Payer: Self-pay | Admitting: Nurse Practitioner

## 2022-12-26 NOTE — Telephone Encounter (Signed)
[facility] send surgery clearance to [place]

## 2022-12-31 NOTE — Telephone Encounter (Signed)
Fax received from Dr. Marcene Corning with [facility] to perform a Right Total Anterior Hip Arthroplasty on patient.  Patient needs surgery clearance. Surgery is Pending. Patient was seen on 08/09/22. Office protocol is a risk assessment can be sent to surgeon if patient has been seen in 60 days or less.   Sending to [employer] for risk assessment or recommendations if patient needs to be seen in office prior to surgical procedure.    Patient is wanting to know if clearance can be done since she was seen in February. I did advise our office protocol is we have to see them 60 days prior. She states copay is very expensive. Katie please advise

## 2023-01-01 ENCOUNTER — Encounter: Payer: Self-pay | Admitting: Otolaryngology

## 2023-01-01 NOTE — Anesthesia Preprocedure Evaluation (Addendum)
Anesthesia Evaluation  Patient identified by MRN, date of birth, ID band Patient awake    Reviewed: Allergy & Precautions, H&P , NPO status , Patient's Chart, lab work & pertinent test results  Airway Mallampati: III  TM Distance: >3 FB Neck ROM: Full    Dental no notable dental hx.    Pulmonary sleep apnea , COPD, Current Smoker OSA on CPAP Centrilobular emphysema   Pulmonary exam normal breath sounds clear to auscultation       Cardiovascular + CAD  Normal cardiovascular exam Rhythm:Regular Rate:Normal  .  Preoperative Cardiovascular Risk Assessment:   According to the Revised Cardiac Risk Index (RCRI), her Perioperative Risk of Major Cardiac Event is (%): 0.4. Her Functional Capacity in METs is: 7.34 according to the Duke Activity Status Index (DASI).Therefore, based on ACC/AHA guidelines, patient would be at acceptable risk for the planned procedure without further cardiovascular testing.     The patient was advised that if she develops new symptoms prior to surgery to contact our office to arrange for a follow-up visit, and she verbalized understanding.    A copy of this note will be routed to requesting surgeon.   Time:   Today, I have spent 5 minutes with the patient with telehealth technology discussing medical history, symptoms, and management plan.      Joylene Grapes, NP   12/23/2022, 3:06 PM  Coronary artery calcifications per CT    Neuro/Psych negative neurological ROS  negative psych ROS   GI/Hepatic Neg liver ROS,GERD  ,,  Endo/Other  negative endocrine ROS    Renal/GU negative Renal ROS  negative genitourinary   Musculoskeletal  (+) Arthritis ,    Abdominal   Peds negative pediatric ROS (+)  Hematology negative hematology ROS (+)   Anesthesia Other Findings OSA and CPAP overlap syndrome OSA on CPAP Migraines arthritis  Reproductive/Obstetrics negative OB ROS                               Anesthesia Physical Anesthesia Plan  ASA: 2  Anesthesia Plan: General ETT   Post-op Pain Management:    Induction: Intravenous  PONV Risk Score and Plan:   Airway Management Planned: Oral ETT  Additional Equipment:   Intra-op Plan:   Post-operative Plan: Extubation in OR  Informed Consent: I have reviewed the patients History and Physical, chart, labs and discussed the procedure including the risks, benefits and alternatives for the proposed anesthesia with the patient or authorized representative who has indicated his/her understanding and acceptance.     Dental Advisory Given  Plan Discussed with: Anesthesiologist, CRNA and Surgeon  Anesthesia Plan Comments: (Patient consented for risks of anesthesia including but not limited to:  - adverse reactions to medications - damage to eyes, teeth, lips or other oral mucosa - nerve damage due to positioning  - sore throat or hoarseness - Damage to heart, brain, nerves, lungs, other parts of body or loss of life  Patient voiced understanding.)         Anesthesia Quick Evaluation

## 2023-01-02 NOTE — Telephone Encounter (Signed)
We follow her for OSA. She was very compliant at her last visit.  Moderate risk. Factors that increase the risk for postoperative pulmonary complications are OSA, obesity   Respiratory complications generally occur in 1% of ASA Class I patients, 5% of ASA Class II and 10% of ASA Class III-IV patients These complications rarely result in mortalityand include postoperative pneumonia, atelectasis, pulmonary embolism, ARDS and increased time requiring postoperative mechanical ventilation.  Overall, I recommend proceeding with the surgeryif the risk for respiratory complications are outweighed by the potential benefits. This will need to be discussed between the patient and surgeon.  To reduce risksof respiratory complications, I recommend: --Pre- and post-operative incentive spirometry performed frequently while awake --Inpatient use of currently prescribed positive-pressure for OSA whenever the patient is sleeping --Short duration of surgery as much as possible and avoid paralytic if possible  --OOB, encourage mobility post-op  Please advise her to take her mask/tubing with her to surgery or take her entire CPAP machine. Thanks.

## 2023-01-02 NOTE — Telephone Encounter (Signed)
Let PT know we have fax'd this report to the Dr. For clearance and read to her Ms. Cobb's request she bring her Cpap hose/mask or the whole machine. NFN.

## 2023-01-07 ENCOUNTER — Other Ambulatory Visit: Payer: Self-pay | Admitting: Orthopaedic Surgery

## 2023-01-08 ENCOUNTER — Ambulatory Visit
Admission: RE | Admit: 2023-01-08 | Discharge: 2023-01-08 | Disposition: A | Discharge status: Home / Self Care | Payer: 59 | Attending: Otolaryngology | Admitting: Otolaryngology

## 2023-01-08 ENCOUNTER — Encounter: Payer: Self-pay | Admitting: Otolaryngology

## 2023-01-08 ENCOUNTER — Ambulatory Visit: Payer: 59 | Admitting: Anesthesiology

## 2023-01-08 ENCOUNTER — Other Ambulatory Visit: Payer: Self-pay

## 2023-01-08 ENCOUNTER — Encounter
Admission: RE | Disposition: A | Discharge status: Home / Self Care | Payer: Self-pay | Source: Home / Self Care | Attending: Otolaryngology

## 2023-01-08 DIAGNOSIS — J432 Centrilobular emphysema: Secondary | ICD-10-CM | POA: Diagnosis not present

## 2023-01-08 DIAGNOSIS — G4733 Obstructive sleep apnea (adult) (pediatric): Secondary | ICD-10-CM | POA: Insufficient documentation

## 2023-01-08 DIAGNOSIS — D104 Benign neoplasm of tonsil: Secondary | ICD-10-CM | POA: Insufficient documentation

## 2023-01-08 DIAGNOSIS — I2510 Atherosclerotic heart disease of native coronary artery without angina pectoris: Secondary | ICD-10-CM | POA: Diagnosis not present

## 2023-01-08 DIAGNOSIS — K219 Gastro-esophageal reflux disease without esophagitis: Secondary | ICD-10-CM | POA: Diagnosis not present

## 2023-01-08 DIAGNOSIS — G4730 Sleep apnea, unspecified: Secondary | ICD-10-CM | POA: Diagnosis not present

## 2023-01-08 DIAGNOSIS — M1990 Unspecified osteoarthritis, unspecified site: Secondary | ICD-10-CM | POA: Diagnosis not present

## 2023-01-08 DIAGNOSIS — D1039 Benign neoplasm of other parts of mouth: Secondary | ICD-10-CM | POA: Diagnosis not present

## 2023-01-08 DIAGNOSIS — F17200 Nicotine dependence, unspecified, uncomplicated: Secondary | ICD-10-CM | POA: Insufficient documentation

## 2023-01-08 HISTORY — PX: MASS EXCISION: SHX2000

## 2023-01-08 HISTORY — DX: Obstructive sleep apnea (adult) (pediatric): G47.33

## 2023-01-08 HISTORY — DX: Unspecified osteoarthritis, unspecified site: M19.90

## 2023-01-08 SURGERY — EXCISION MASS
Anesthesia: General | Site: Mouth | Laterality: Bilateral

## 2023-01-08 MED ORDER — LIDOCAINE-EPINEPHRINE 1 %-1:100000 IJ SOLN
INTRAMUSCULAR | Status: DC | PRN
  Administered 2023-01-08: 2 mL

## 2023-01-08 MED ORDER — DEXMEDETOMIDINE HCL IN NACL 80 MCG/20ML IV SOLN
INTRAVENOUS | Status: DC | PRN
  Administered 2023-01-08: 4 ug via INTRAVENOUS
  Administered 2023-01-08: 8 ug via INTRAVENOUS

## 2023-01-08 MED ORDER — OXYCODONE HCL 5 MG/5ML PO SOLN
5.0000 mg | ORAL | Status: AC
  Administered 2023-01-08: 5 mg via ORAL

## 2023-01-08 MED ORDER — LACTATED RINGERS IV SOLN: INTRAVENOUS | Status: DC

## 2023-01-08 MED ORDER — SUCCINYLCHOLINE CHLORIDE 200 MG/10ML IV SOSY
PREFILLED_SYRINGE | INTRAVENOUS | Status: DC | PRN
  Administered 2023-01-08: 100 mg via INTRAVENOUS

## 2023-01-08 MED ORDER — LIDOCAINE VISCOUS HCL 2 % MT SOLN: 10.0000 mL | Freq: Four times a day (QID) | OROMUCOSAL | 0 refills | Status: DC | PRN

## 2023-01-08 MED ORDER — DEXAMETHASONE SODIUM PHOSPHATE 4 MG/ML IJ SOLN
INTRAMUSCULAR | Status: DC | PRN
  Administered 2023-01-08: 8 mg via INTRAVENOUS

## 2023-01-08 MED ORDER — ONDANSETRON HCL 4 MG/2ML IJ SOLN
INTRAMUSCULAR | Status: DC | PRN
  Administered 2023-01-08: 4 mg via INTRAVENOUS

## 2023-01-08 MED ORDER — OXYCODONE HCL 5 MG PO TABS: 10.0000 mg | ORAL_TABLET | Freq: Once | ORAL | Status: DC

## 2023-01-08 MED ORDER — PROPOFOL 10 MG/ML IV BOLUS
INTRAVENOUS | Status: DC | PRN
  Administered 2023-01-08: 50 mg via INTRAVENOUS
  Administered 2023-01-08: 150 mg via INTRAVENOUS

## 2023-01-08 MED ORDER — LIDOCAINE HCL (CARDIAC) PF 100 MG/5ML IV SOSY
PREFILLED_SYRINGE | INTRAVENOUS | Status: DC | PRN
  Administered 2023-01-08: 100 mg via INTRAVENOUS

## 2023-01-08 MED ORDER — ACETAMINOPHEN 10 MG/ML IV SOLN
INTRAVENOUS | Status: DC | PRN
  Administered 2023-01-08: 1000 mg via INTRAVENOUS

## 2023-01-08 MED ORDER — FENTANYL CITRATE (PF) 100 MCG/2ML IJ SOLN
INTRAMUSCULAR | Status: DC | PRN
  Administered 2023-01-08: 50 ug via INTRAVENOUS

## 2023-01-08 MED ORDER — HYDROCODONE-ACETAMINOPHEN 7.5-325 MG/15ML PO SOLN: 10.0000 mL | Freq: Four times a day (QID) | ORAL | 0 refills | Status: DC | PRN

## 2023-01-08 MED ORDER — ONDANSETRON HCL 4 MG PO TABS: 4.0000 mg | ORAL_TABLET | Freq: Three times a day (TID) | ORAL | 0 refills | Status: DC | PRN

## 2023-01-08 MED ORDER — MIDAZOLAM HCL 5 MG/5ML IJ SOLN
INTRAMUSCULAR | Status: DC | PRN
  Administered 2023-01-08: 2 mg via INTRAVENOUS

## 2023-01-08 SURGICAL SUPPLY — 17 items
BLADE ELECT COATED/INSUL 125 (ELECTRODE) ×1 IMPLANT
CANISTER SUCT 1200ML W/VALVE (MISCELLANEOUS) IMPLANT
CATH ROBINSON RED A/P 10FR (CATHETERS) IMPLANT
DRSG TELFA 4X3 1S NADH ST (GAUZE/BANDAGES/DRESSINGS) IMPLANT
ELECT CAUTERY NDL 2.0 MIC (NEEDLE) ×1 IMPLANT
ELECT CAUTERY NEEDLE 2.0 MIC (NEEDLE) ×1 IMPLANT
ELECT REM PT RETURN 9FT ADLT (ELECTROSURGICAL) ×1
ELECTRODE REM PT RTRN 9FT ADLT (ELECTROSURGICAL) ×1 IMPLANT
GAUZE SPONGE 4X4 12PLY STRL (GAUZE/BANDAGES/DRESSINGS) IMPLANT
GLOVE SURG GAMMEX PI TX LF 7.5 (GLOVE) ×2 IMPLANT
GOWN STRL REUS W/ TWL LRG LVL3 (GOWN DISPOSABLE) ×1 IMPLANT
GOWN STRL REUS W/TWL LRG LVL3 (GOWN DISPOSABLE) ×1
KIT TURNOVER KIT A (KITS) ×1 IMPLANT
PACK ENT CUSTOM (PACKS) ×1 IMPLANT
PENCIL SMOKE EVACUATOR (MISCELLANEOUS) ×1 IMPLANT
SLEEVE SUCTION 125 (MISCELLANEOUS) IMPLANT
STRAP BODY AND KNEE 60X3 (MISCELLANEOUS) ×1 IMPLANT

## 2023-01-08 NOTE — H&P (Signed)
..  History and Physical paper copy reviewed and updated date of procedure and will be scanned into system.  Patient seen and examined.  

## 2023-01-08 NOTE — Op Note (Signed)
..  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  Procedure: After the patient was identified in holding and the history and physical and consent was reviewed, the patient was taken to the operating room and placed in a supine position.  General endotracheal anesthesia was induced in the normal fashion.  At this time, the patient was rotated 45 degrees and a shoulder roll was placed.  At this time, a McIvor mouthgag was inserted into the patient's oral cavity and suspended from the Mayo stand without injury to teeth, lips, or gums.  Next a red rubber catheter was inserted into the patient left nostril for retraction of the uvula and soft palate superiorly.   A needle tip Bovie electrocautery was used to excise the midline palate lesion.  A 2nd anterior tonsil lesion was seen and this was gently grasped with forceps and removed with bovie.  A 3rd larger lesion was next seen more inferiorly along the left anterior tonsil pillar.  This was excised in a similar fashion with bovie electrocautery.  At this time, evaluation of the patient's oropharynx was made.  This demonstrated no further lesions.  The patient's areas of excision were anesthetized with 1% lidocaine with 1:100,000 epinephrine 2ml.  Following this  The care of patient was returned to anesthesia, awakened, and transferred to recovery in stable condition.  Dispo:  PACU to home  Plan: Soft diet.  Fluid hydration  Recheck my office three weeks.   Kathryn Mans  Kathryn Mason 8:35 AM 01/08/2023

## 2023-01-08 NOTE — Transfer of Care (Signed)
Immediate Anesthesia Transfer of Care Note  Patient: Kathryn Mason  Procedure(s) Performed: EXCISION ORAL PAPILLOMAS ON HARD PALATE AND RIGHT TONSILLAR PILAR (Bilateral: Mouth)  Patient Location: PACU  Anesthesia Type: General ETT  Level of Consciousness: awake, alert  and patient cooperative  Airway and Oxygen Therapy: Patient Spontanous Breathing and Patient connected to supplemental oxygen  Post-op Assessment: Post-op Vital signs reviewed, Patient's Cardiovascular Status Stable, Respiratory Function Stable, Patent Airway and No signs of Nausea or vomiting  Post-op Vital Signs: Reviewed and stable  Complications: No notable events documented.

## 2023-01-08 NOTE — Anesthesia Postprocedure Evaluation (Signed)
Anesthesia Post Note  Patient: Kathryn Mason  Procedure(s) Performed: EXCISION ORAL PAPILLOMAS ON HARD PALATE AND RIGHT TONSILLAR PILAR (Bilateral: Mouth)  Patient location during evaluation: PACU Anesthesia Type: General Level of consciousness: awake and alert Pain management: pain level controlled Vital Signs Assessment: post-procedure vital signs reviewed and stable Respiratory status: spontaneous breathing, nonlabored ventilation, respiratory function stable and patient connected to nasal cannula oxygen Cardiovascular status: blood pressure returned to baseline and stable Postop Assessment: no apparent nausea or vomiting Anesthetic complications: no   No notable events documented.   Last Vitals:  Vitals:   01/08/23 0900 01/08/23 0915  BP: 123/68 110/68  Pulse: 71 65  Resp: 16 13  Temp:  36.6 C  SpO2: 95% 98%    Last Pain:  Vitals:   01/08/23 0936  TempSrc:   PainSc: 2                  Merlin Golden C Lucienne Sawyers

## 2023-01-08 NOTE — Anesthesia Procedure Notes (Addendum)
Procedure Name: Intubation Date/Time: 01/08/2023 8:22 AM  Performed by: Reford Olliff, [place], CRNAPre-anesthesia Checklist: Patient identified, Patient being monitored, Timeout performed, Emergency Drugs available and Suction available Patient Re-evaluated:Patient Re-evaluated prior to induction Oxygen Delivery Method: Circle system utilized Preoxygenation: Pre-oxygenation with 100% oxygen Induction Type: IV induction Ventilation: Mask ventilation without difficulty Laryngoscope Size: Mac and 4 Grade View: Grade I Tube type: Oral Tube size: 7.0 mm Number of attempts: 2 Airway Equipment and Method: Stylet Placement Confirmation: ETT inserted through vocal cords under direct vision, positive ETCO2 and breath sounds checked- equal and bilateral Secured at: 22 cm Tube secured with: Tape Dental Injury: Teeth and Oropharynx as per pre-operative assessment

## 2023-01-10 ENCOUNTER — Encounter: Payer: Self-pay | Admitting: Nurse Practitioner

## 2023-01-30 NOTE — Patient Instructions (Addendum)
SURGICAL WAITING ROOM VISITATION Patients having surgery or a procedure may have no more than 2 support people in the waiting area - these visitors may rotate in the visitor waiting room.   Due to an increase in RSV and influenza rates and associated hospitalizations, children ages 14 and under may not visit patients in [facility] hospitals. If the patient needs to stay at the hospital during part of their recovery, the visitor guidelines for inpatient rooms apply.  PRE-OP VISITATION  Pre-op nurse will coordinate an appropriate time for 1 support person to accompany the patient in pre-op.  This support person may not rotate.  This visitor will be contacted when the time is appropriate for the visitor to come back in the pre-op area.  Please refer to the [facility] website for the visitor guidelines for Inpatients (after your surgery is over and you are in a regular room).  You are not required to quarantine at this time prior to your surgery. However, you must do this: Hand Hygiene often Do NOT share personal items Notify your provider if you are in close contact with someone who has COVID or you develop fever 100.4 or greater, new onset of sneezing, cough, sore throat, shortness of breath or body aches.  If you test positive for Covid or have been in contact with anyone that has tested positive in the last 10 days please notify you surgeon.    Your procedure is scheduled on:  Tuesday  February 11, 2023  Report to [facility] [address] Main Entrance: Leota Jacobsen entrance where the [employer] is available.   Report to admitting at: 07:15    AM  Call this number if you have any questions or problems the morning of surgery [phone]  Do not eat food after Midnight the night prior to your surgery/procedure.  After Midnight you may have the following liquids until  06:45 AM DAY OF SURGERY  Clear Liquid Diet Water Black Coffee (sugar ok, NO MILK/CREAM OR CREAMERS)  Tea (sugar ok, NO  MILK/CREAM OR CREAMERS) regular and decaf                             Plain Jell-O  with no fruit (NO RED)                                           Fruit ices (not with fruit pulp, NO RED)                                     Popsicles (NO RED)                                                                  Juice: NO CITRUS JUICES: only apple, WHITE grape, WHITE cranberry Sports drinks like Gatorade or Powerade (NO RED)                   The day of surgery:  Drink ONE (1) Pre-Surgery Clear Ensure at   06:45   AM the morning  of surgery. Drink in one sitting. Do not sip.  This drink was given to you during your hospital pre-op appointment visit. Nothing else to drink after completing the Pre-Surgery Clear Ensure : No candy, chewing gum or throat lozenges.    FOLLOW ADDITIONAL PRE OP INSTRUCTIONS YOU RECEIVED FROM YOUR SURGEON'S OFFICE!!!   Oral Hygiene is also important to reduce your risk of infection.        Remember - BRUSH YOUR TEETH THE MORNING OF SURGERY WITH YOUR REGULAR TOOTHPASTE  Do NOT smoke after Midnight the night before surgery.  Take ONLY these medicines the morning of surgery with A SIP OF WATER: None   If You have been diagnosed with Sleep Apnea - Bring CPAP mask and tubing day of surgery. We will provide you with a CPAP machine on the day of your surgery.                   You may not have any metal on your body including hair pins, jewelry, and body piercing  Do not wear make-up, lotions, powders, perfumes or deodorant  Do not wear nail polish including gel and S&S, artificial / acrylic nails, or any other type of covering on natural nails including finger and toenails. If you have artificial nails, gel coating, etc., that needs to be removed by a nail salon, Please have this removed prior to surgery. Not doing so may mean that your surgery could be cancelled or delayed if the Surgeon or anesthesia staff feels like they are unable to monitor you safely.   Do not  shave 48 hours prior to surgery to avoid nicks in your skin which may contribute to postoperative infections.    Patients discharged on the day of surgery will not be allowed to drive home.  Someone NEEDS to stay with you for the first 24 hours after anesthesia.  Do not bring your home medications to the hospital. The Pharmacy will dispense medications listed on your medication list to you during your admission in the Hospital.   Please read over the following fact sheets you were given: IF YOU HAVE QUESTIONS ABOUT YOUR PRE-OP INSTRUCTIONS, PLEASE CALL [phone].     Pre-operative 5 CHG Bath Instructions   You can play a key role in reducing the risk of infection after surgery. Your skin needs to be as free of germs as possible. You can reduce the number of germs on your skin by washing with CHG (chlorhexidine gluconate) soap before surgery. CHG is an antiseptic soap that kills germs and continues to kill germs even after washing.   DO NOT use if you have an allergy to chlorhexidine/CHG or antibacterial soaps. If your skin becomes reddened or irritated, stop using the CHG and notify one of our RNs at [phone]  Please shower with the CHG soap starting 4 days before surgery using the following schedule: START SHOWERS ON  FRIDAY  February 07, 2023  Please keep in mind the following:  DO NOT shave, including legs and underarms, starting the day of your first shower.   You may shave your face at any point before/day of surgery.   Place clean sheets on your bed the day you start using CHG soap. Use a clean washcloth (not used since being washed) for each shower. DO NOT sleep with pets once you start using the CHG.   CHG Shower Instructions:  If you choose to wash your hair and private area, wash first with  your normal shampoo/soap.  After you use shampoo/soap, rinse your hair and body thoroughly to remove shampoo/soap residue.  Turn the water OFF and apply about 3 tablespoons (45 ml) of CHG soap to a CLEAN washcloth.  Apply CHG soap ONLY FROM YOUR NECK DOWN TO YOUR TOES (washing for 3-5 minutes)  DO NOT use CHG soap on face, private areas, open wounds, or sores.  Pay special attention to the area where your surgery is being performed.  If you are having back surgery, having someone wash your back for you may be helpful.  Wait 2 minutes after CHG soap is applied, then you may rinse off the CHG soap.  Pat dry with a clean towel  Put on clean clothes/pajamas   If you choose to wear lotion, please use ONLY the CHG-compatible lotions on the back of this paper.     Additional instructions for the day of surgery: DO NOT APPLY any lotions, deodorants, cologne, or perfumes.   Put on clean/comfortable clothes.  Brush your teeth.  Ask your nurse before applying any prescription medications to the skin.      CHG Compatible Lotions   Aveeno Moisturizing lotion  Cetaphil Moisturizing Cream  Cetaphil Moisturizing Lotion  Clairol Herbal Essence Moisturizing Lotion, Dry Skin  Clairol Herbal Essence Moisturizing Lotion, Extra Dry Skin  Clairol Herbal Essence Moisturizing Lotion, Normal Skin  Curel Age Defying Therapeutic Moisturizing Lotion with Alpha Hydroxy  Curel Extreme Care Body Lotion  Curel Soothing Hands Moisturizing Hand Lotion  Curel Therapeutic Moisturizing Cream, Fragrance-Free  Curel Therapeutic Moisturizing Lotion, Fragrance-Free  Curel Therapeutic Moisturizing Lotion, Original Formula  Eucerin Daily Replenishing Lotion  Eucerin Dry Skin Therapy Plus Alpha Hydroxy Crme  Eucerin Dry Skin Therapy Plus Alpha Hydroxy Lotion  Eucerin Original Crme  Eucerin Original Lotion  Eucerin Plus Crme Eucerin Plus Lotion  Eucerin TriLipid Replenishing Lotion  Keri Anti-Bacterial Hand  Lotion  Keri Deep Conditioning Original Lotion Dry Skin Formula Softly Scented  Keri Deep Conditioning Original Lotion, Fragrance Free Sensitive Skin Formula  Keri Lotion Fast Absorbing Fragrance Free Sensitive Skin Formula  Keri Lotion Fast Absorbing Softly Scented Dry Skin Formula  Keri Original Lotion  Keri Skin Renewal Lotion Keri Silky Smooth Lotion  Keri Silky Smooth Sensitive Skin Lotion  Nivea Body Creamy Conditioning Oil  Nivea Body Extra Enriched Lotion  Nivea Body Original Lotion  Nivea Body Sheer Moisturizing Lotion Nivea Crme  Nivea Skin Firming Lotion  NutraDerm 30 Skin Lotion  NutraDerm Skin Lotion  NutraDerm Therapeutic Skin Cream  NutraDerm Therapeutic Skin Lotion  ProShield Protective Hand Cream  Provon moisturizing lotion   FAILURE TO FOLLOW THESE INSTRUCTIONS MAY RESULT IN THE CANCELLATION OF YOUR SURGERY  PATIENT SIGNATURE_________________________________  NURSE SIGNATURE__________________________________  ________________________________________________________________________      Kathryn Mason    An incentive spirometer is a tool that can help keep your lungs clear and active. This tool measures how well you are filling your lungs with each breath. Taking long  deep breaths may help reverse or decrease the chance of developing breathing (pulmonary) problems (especially infection) following: A long period of time when you are unable to move or be active. BEFORE THE PROCEDURE  If the spirometer includes an indicator to show your best effort, your nurse or respiratory therapist will set it to a desired goal. If possible, sit up straight or lean slightly forward. Try not to slouch. Hold the incentive spirometer in an upright position. INSTRUCTIONS FOR USE  Sit on the edge of your bed if possible, or sit up as far as you can in bed or on a chair. Hold the incentive spirometer in an upright position. Breathe out normally. Place the mouthpiece in  your mouth and seal your lips tightly around it. Breathe in slowly and as deeply as possible, raising the piston or the ball toward the top of the column. Hold your breath for 3-5 seconds or for as long as possible. Allow the piston or ball to fall to the bottom of the column. Remove the mouthpiece from your mouth and breathe out normally. Rest for a few seconds and repeat Steps 1 through 7 at least 10 times every 1-2 hours when you are awake. Take your time and take a few normal breaths between deep breaths. The spirometer may include an indicator to show your best effort. Use the indicator as a goal to work toward during each repetition. After each set of 10 deep breaths, practice coughing to be sure your lungs are clear. If you have an incision (the cut made at the time of surgery), support your incision when coughing by placing a pillow or rolled up towels firmly against it. Once you are able to get out of bed, walk around indoors and cough well. You may stop using the incentive spirometer when instructed by your caregiver.  RISKS AND COMPLICATIONS Take your time so you do not get dizzy or light-headed. If you are in pain, you may need to take or ask for pain medication before doing incentive spirometry. It is harder to take a deep breath if you are having pain. AFTER USE Rest and breathe slowly and easily. It can be helpful to keep track of a log of your progress. Your caregiver can provide you with a simple table to help with this. If you are using the spirometer at home, follow these instructions: SEEK MEDICAL CARE IF:  You are having difficultly using the spirometer. You have trouble using the spirometer as often as instructed. Your pain medication is not giving enough relief while using the spirometer. You develop fever of 100.5 F (38.1 C) or higher.                                                                                                    SEEK IMMEDIATE MEDICAL CARE IF:  You  cough up bloody sputum that had not been present before. You develop fever of 102 F (38.9 C) or greater. You develop worsening pain at or near the incision site. MAKE SURE YOU:  Understand these instructions. Will watch your condition. Will  get help right away if you are not doing well or get worse. Document Released: 11/04/2006 Document Revised: 09/16/2011 Document Reviewed: 01/05/2007 [facility] Patient Information 2014 [place], [place].      WHAT IS A BLOOD TRANSFUSION? Blood Transfusion Information  A transfusion is the replacement of blood or some of its parts. Blood is made up of multiple cells which provide different functions. Red blood cells carry oxygen and are used for blood loss replacement. White blood cells fight against infection. Platelets control bleeding. Plasma helps clot blood. Other blood products are available for specialized needs, such as hemophilia or other clotting disorders. BEFORE THE TRANSFUSION  Who gives blood for transfusions?  Healthy volunteers who are fully evaluated to make sure their blood is safe. This is blood bank blood. Transfusion therapy is the safest it has ever been in the practice of medicine. Before blood is taken from a donor, a complete history is taken to make sure that person has no history of diseases nor engages in risky social behavior (examples are intravenous drug use or sexual activity with multiple partners). The donor's travel history is screened to minimize risk of transmitting infections, such as malaria. The donated blood is tested for signs of infectious diseases, such as HIV and hepatitis. The blood is then tested to be sure it is compatible with you in order to minimize the chance of a transfusion reaction. If you or a relative donates blood, this is often done in anticipation of surgery and is not appropriate for emergency situations. It takes many days to process the donated blood. RISKS AND COMPLICATIONS Although  transfusion therapy is very safe and saves many lives, the main dangers of transfusion include:  Getting an infectious disease. Developing a transfusion reaction. This is an allergic reaction to something in the blood you were given. Every precaution is taken to prevent this. The decision to have a blood transfusion has been considered carefully by your caregiver before blood is given. Blood is not given unless the benefits outweigh the risks. AFTER THE TRANSFUSION Right after receiving a blood transfusion, you will usually feel much better and more energetic. This is especially true if your red blood cells have gotten low (anemic). The transfusion raises the level of the red blood cells which carry oxygen, and this usually causes an energy increase. The nurse administering the transfusion will monitor you carefully for complications. HOME CARE INSTRUCTIONS  No special instructions are needed after a transfusion. You may find your energy is better. Speak with your caregiver about any limitations on activity for underlying diseases you may have. SEEK MEDICAL CARE IF:  Your condition is not improving after your transfusion. You develop redness or irritation at the intravenous (IV) site. SEEK IMMEDIATE MEDICAL CARE IF:  Any of the following symptoms occur over the next 12 hours: Shaking chills. You have a temperature by mouth above 102 F (38.9 C), not controlled by medicine. Chest, back, or muscle pain. People around you feel you are not acting correctly or are confused. Shortness of breath or difficulty breathing. Dizziness and fainting. You get a rash or develop hives. You have a decrease in urine output. Your urine turns a dark color or changes to pink, red, or brown. Any of the following symptoms occur over the next 10 days: You have a temperature by mouth above 102 F (38.9 C), not controlled by medicine. Shortness of breath. Weakness after normal activity. The white part of the eye  turns yellow (jaundice). You have a decrease  in the amount of urine or are urinating less often. Your urine turns a dark color or changes to pink, red, or brown. Document Released: 06/21/2000 Document Revised: 09/16/2011 Document Reviewed: 02/08/2008 [facility] Patient Information 2014 [place], [place].  _______________________________________________________________________

## 2023-01-30 NOTE — Progress Notes (Addendum)
COVID Vaccine received:  [x]  No []  Yes Date of any COVID positive Test in last 90 days:  none  PCP - Romona Curls, MD at [facility] [phone] Cardiologist - Bryan Lemma, MD     Bernadene Person, NP  cardiac clearance in 12-23-22 note Pulmonology- Coralyn Helling, MD   Micheline Maze, NP  clearance in 01-02-23 phone note  Chest x-ray - MD order EKG -  02-05-2022  MD order for repeat Stress Test -  ECHO -  Cardiac Cath -    PCR screen: [x]  Ordered & Completed           []   No Order but Needs PROFEND           []   N/A for this surgery  Surgery Plan:  [x]  Ambulatory                            []  Outpatient in bed                            []  Admit  Anesthesia:    []  General  [x]  Spinal                           []   Choice []   MAC  Pacemaker / ICD device [x]  No []  Yes   Spinal Cord Stimulator:[x]  No []  Yes       History of Sleep Apnea? []  No [x]  Yes   CPAP used?- []  No [x]  Yes    Does the patient monitor blood sugar?          []  No []  Yes  [x]  N/A  Patient has: [x]  NO Hx DM   []  Pre-DM                 []  DM1  []   DM2  Blood Thinner / Instructions:none Aspirin Instructions:  none  ERAS Protocol Ordered: []  No  [x]  Yes PRE-SURGERY [x]  ENSURE  []  G2  Patient is to be NPO after: 06:45  Comments: Patient was given the 5 CHG shower / bath instructions for THA surgery along with 2 bottles of the CHG soap. Patient will start this on:  Friday  02-07-2023 All questions were asked and answered, Patient voiced understanding of this process.   Activity level: Patient is able to climb a flight of stairs without difficulty; [x]  No CP , but would have _SOB, leg pain.   Patient can perform ADLs without assistance.   Anesthesia review:OSA-CPAP, coronary calcification, GERD, COPD, Smoker   Patient denies shortness of breath, fever, cough and chest pain at PAT appointment.  Patient verbalized understanding and agreement to the Pre-Surgical Instructions that were given to them at this PAT  appointment. Patient was also educated of the need to review these PAT instructions again prior to her surgery.I reviewed the appropriate phone numbers to call if they have any and questions or concerns.

## 2023-01-31 ENCOUNTER — Ambulatory Visit (HOSPITAL_COMMUNITY): Admission: RE | Admit: 2023-01-31 | Payer: 59 | Source: Ambulatory Visit

## 2023-01-31 ENCOUNTER — Other Ambulatory Visit: Payer: Self-pay

## 2023-01-31 ENCOUNTER — Encounter (HOSPITAL_COMMUNITY): Payer: Self-pay

## 2023-01-31 ENCOUNTER — Encounter (HOSPITAL_COMMUNITY)
Admission: RE | Admit: 2023-01-31 | Discharge: 2023-01-31 | Disposition: A | Discharge status: Home / Self Care | Payer: 59 | Source: Ambulatory Visit | Attending: Orthopaedic Surgery | Admitting: Orthopaedic Surgery

## 2023-01-31 VITALS — BP 117/67 | HR 63 | Temp 98.2°F | Resp 20 | Ht 69.0 in | Wt 273.0 lb

## 2023-01-31 DIAGNOSIS — I4510 Unspecified right bundle-branch block: Secondary | ICD-10-CM | POA: Diagnosis not present

## 2023-01-31 DIAGNOSIS — R001 Bradycardia, unspecified: Secondary | ICD-10-CM | POA: Diagnosis not present

## 2023-01-31 DIAGNOSIS — I2510 Atherosclerotic heart disease of native coronary artery without angina pectoris: Secondary | ICD-10-CM | POA: Insufficient documentation

## 2023-01-31 DIAGNOSIS — Z01818 Encounter for other preprocedural examination: Secondary | ICD-10-CM | POA: Diagnosis not present

## 2023-01-31 HISTORY — DX: Atherosclerotic heart disease of native coronary artery without angina pectoris: I25.10

## 2023-01-31 HISTORY — DX: Headache, unspecified: R51.9

## 2023-01-31 LAB — BASIC METABOLIC PANEL
Anion gap: 9 (ref 5–15)
BUN: 16 mg/dL (ref 6–20)
CO2: 22 mmol/L (ref 22–32)
Calcium: 9 mg/dL (ref 8.9–10.3)
Chloride: 108 mmol/L (ref 98–111)
Creatinine, Ser: 0.8 mg/dL (ref 0.44–1.00)
GFR, Estimated: >60 mL/min (ref >60)
Glucose, Bld: 101 mg/dL — ABNORMAL HIGH (ref 70–99)
Potassium: 4.2 mmol/L (ref 3.5–5.1)
Sodium: 139 mmol/L (ref 135–145)

## 2023-01-31 LAB — SURGICAL PCR SCREEN
MRSA, PCR: NEGATIVE
Staphylococcus aureus: NEGATIVE

## 2023-01-31 LAB — CBC
HCT: 40.1 % (ref 36.0–46.0)
Hemoglobin: 12.8 g/dL (ref 12.0–15.0)
MCH: 31.4 pg (ref 26.0–34.0)
MCHC: 31.9 g/dL (ref 30.0–36.0)
MCV: 98.3 fL (ref 80.0–100.0)
Platelets: 161 10*3/uL (ref 150–400)
RBC: 4.08 MIL/uL (ref 3.87–5.11)
RDW: 13.6 % (ref 11.5–15.5)
WBC: 7.2 10*3/uL (ref 4.0–10.5)
nRBC: 0 % (ref 0.0–0.2)

## 2023-01-31 LAB — TYPE AND SCREEN
ABO/RH(D): A POS
Antibody Screen: NEGATIVE

## 2023-02-03 NOTE — H&P (Signed)
TOTAL HIP ADMISSION H&P  Patient is admitted for right total hip arthroplasty.  Subjective:  Chief Complaint: right hip pain  HPI: Kathryn Mason, 57 y.o. female, has a history of pain and functional disability in the right hip(s) due to arthritis and patient has failed non-surgical conservative treatments for greater than 12 weeks to include NSAID's and/or analgesics, corticosteriod injections, flexibility and strengthening excercises, supervised PT with diminished ADL's post treatment, use of assistive devices, weight reduction as appropriate, and activity modification.  Onset of symptoms was gradual starting 5 years ago with gradually worsening course since that time.The patient noted no past surgery on the right hip(s).  Patient currently rates pain in the right hip at 10 out of 10 with activity. Patient has night pain, worsening of pain with activity and weight bearing, trendelenberg gait, pain that interfers with activities of daily living, and crepitus. Patient has evidence of subchondral cysts, subchondral sclerosis, periarticular osteophytes, and joint space narrowing by imaging studies. This condition presents safety issues increasing the risk of falls. There is no current active infection.  Patient Active Problem List   Diagnosis Date Noted   Precordial pain 02/06/2022   Coronary artery calcification seen on CAT scan 02/05/2022   Carotid artery calcification 02/05/2022   Acute cystitis 05/13/2016   Productive cough 05/13/2016   Tennis elbow 02/02/2014   Morbid obesity (HCC) 09/22/2013   GERD (gastroesophageal reflux disease) 09/22/2013   OSA (obstructive sleep apnea) 09/22/2013   Menopausal symptoms 09/22/2013   Dry mouth 09/22/2013   Current every day smoker 09/22/2013   Past Medical History:  Diagnosis Date   Arthritis    Calcification of coronary artery    Cervical cancer (HCC) 07/09/2003   Status post hysterectomy   GERD (gastroesophageal reflux disease)    Headache     migraines   Hx of migraines    OSA and COPD overlap syndrome (HCC)    OSA on CPAP; CT scan centrilobular emphysema   OSA on CPAP    Tubal pregnancy     Past Surgical History:  Procedure Laterality Date   ABDOMINAL HYSTERECTOMY  07/09/2003   ACHILLES TENDON REPAIR Right 2005   CHOLECYSTECTOMY  07/08/1985   MASS EXCISION Bilateral 01/08/2023   Procedure: EXCISION ORAL PAPILLOMAS ON HARD PALATE AND RIGHT TONSILLAR PILAR;  Surgeon: Bud Face, MD;  Location: [facility];  Service: ENT;  Laterality: Bilateral;  sleep apnea   TUBAL LIGATION  07/08/1990    No current facility-administered medications for this encounter.   Current Outpatient Medications  Medication Sig Dispense Refill Last Dose   calcium carbonate (TUMS - DOSED IN MG ELEMENTAL CALCIUM) 500 MG chewable tablet Chew 1 tablet by mouth daily as needed for indigestion or heartburn.      cycloSPORINE (RESTASIS) 0.05 % ophthalmic emulsion Place 1 drop into both eyes 2 (two) times daily as needed (dry eyes).      ibuprofen (ADVIL,MOTRIN) 200 MG tablet Take 400 mg by mouth every 6 (six) hours as needed for moderate pain or headache.      Magnesium 200 MG TABS Take 400 mg by mouth daily after breakfast.      Multiple Vitamin (MULTIVITAMIN) capsule Take 1 capsule by mouth daily.      OMEGA 3-6-9 FATTY ACIDS PO Take 1 capsule by mouth daily.      HYDROcodone-acetaminophen (HYCET) 7.5-325 mg/15 ml solution Take 10 mLs by mouth 4 (four) times daily as needed for moderate pain. (Patient not taking: Reported on 01/28/2023) 150 mL  0 Not Taking   lidocaine (XYLOCAINE) 2 % solution Use as directed 10 mLs in the mouth or throat every 6 (six) hours as needed for mouth pain (Swish and spit). (Patient not taking: Reported on 01/28/2023) 250 mL 0 Not Taking   ondansetron (ZOFRAN) 4 MG tablet Take 1 tablet (4 mg total) by mouth every 8 (eight) hours as needed for nausea or vomiting. (Patient not taking: Reported on 01/28/2023) 20 tablet 0  Not Taking   No Known Allergies  Social History   Tobacco Use   Smoking status: Every Day    Current packs/day: 0.50    Average packs/day: 0.3 packs/day for 39.3 years (11.0 ttl pk-yrs)    Types: Cigarettes    Start date: 11/02/1983   Smokeless tobacco: Never   Tobacco comments:    Pt reports she is only smoking 5 cigarettes pd as of 08/09/2022 Almetta Lovely, CMA  Substance Use Topics   Alcohol use: Yes    Alcohol/week: 0.0 standard drinks of alcohol    Comment: rare    Family History  Problem Relation Age of Onset   Atrial fibrillation Mother    Heart disease Father        Unknown   Hypertension Father    Cancer Father        leukemia   Chronic Renal Failure Sister    Crohn's disease Sister    Crohn's disease Sister    Brain cancer Brother        brain   Heart Problems Brother        Unaware of details.   Heart failure Brother      Review of Systems  Musculoskeletal:  Positive for arthralgias.       Right hip  All other systems reviewed and are negative.   Objective:  Physical Exam Constitutional:      Appearance: Normal appearance.  HENT:     Head: Normocephalic and atraumatic.     Nose: Nose normal.     Mouth/Throat:     Pharynx: Oropharynx is clear.  Eyes:     Extraocular Movements: Extraocular movements intact.  Pulmonary:     Effort: Pulmonary effort is normal.  Abdominal:     Palpations: Abdomen is soft.  Musculoskeletal:     Cervical back: Normal range of motion.     Comments: Right hip motion is limited in rotation and significantly painful.  She cannot cross this leg over the other.  Opposite hip moves well.  Leg lengths are equal.  Skin:    General: Skin is warm and dry.  Neurological:     General: No focal deficit present.     Mental Status: She is alert and oriented to person, place, and time.  Psychiatric:        Mood and Affect: Mood normal.        Behavior: Behavior normal.        Thought Content: Thought content normal.         Judgment: Judgment normal.     Vital signs in last 24 hours:    Labs:   Estimated body mass index is 40.32 kg/m as calculated from the following:   Height as of 01/31/23: 5\' 9"  (1.753 m).   Weight as of 01/31/23: 123.8 kg.   Imaging Review Plain radiographs demonstrate severe degenerative joint disease of the right hip(s). The bone quality appears to be good for age and reported activity level.      Assessment/Plan:  End stage primary arthritis,  right hip(s)  The patient history, physical examination, clinical judgement of the provider and imaging studies are consistent with end stage degenerative joint disease of the right hip(s) and total hip arthroplasty is deemed medically necessary. The treatment options including medical management, injection therapy, arthroscopy and arthroplasty were discussed at length. The risks and benefits of total hip arthroplasty were presented and reviewed. The risks due to aseptic loosening, infection, stiffness, dislocation/subluxation,  thromboembolic complications and other imponderables were discussed.  The patient acknowledged the explanation, agreed to proceed with the plan and consent was signed. Patient is being admitted for inpatient treatment for surgery, pain control, PT, OT, prophylactic antibiotics, VTE prophylaxis, progressive ambulation and ADL's and discharge planning.The patient is planning to be discharged home with home health services

## 2023-02-11 ENCOUNTER — Encounter (HOSPITAL_COMMUNITY)
Admission: RE | Disposition: A | Discharge status: Home / Self Care | Payer: Self-pay | Source: Ambulatory Visit | Attending: Orthopaedic Surgery

## 2023-02-11 ENCOUNTER — Ambulatory Visit (HOSPITAL_COMMUNITY)
Admission: RE | Admit: 2023-02-11 | Discharge: 2023-02-11 | Disposition: A | Discharge status: Home / Self Care | Payer: 59 | Source: Ambulatory Visit | Attending: Orthopaedic Surgery | Admitting: Orthopaedic Surgery

## 2023-02-11 ENCOUNTER — Ambulatory Visit (HOSPITAL_COMMUNITY): Payer: 59

## 2023-02-11 ENCOUNTER — Encounter (HOSPITAL_COMMUNITY): Payer: Self-pay | Admitting: Orthopaedic Surgery

## 2023-02-11 ENCOUNTER — Ambulatory Visit (HOSPITAL_BASED_OUTPATIENT_CLINIC_OR_DEPARTMENT_OTHER): Payer: 59 | Admitting: Anesthesiology

## 2023-02-11 ENCOUNTER — Other Ambulatory Visit: Payer: Self-pay

## 2023-02-11 ENCOUNTER — Ambulatory Visit (HOSPITAL_COMMUNITY): Payer: 59 | Admitting: Anesthesiology

## 2023-02-11 DIAGNOSIS — M1611 Unilateral primary osteoarthritis, right hip: Secondary | ICD-10-CM | POA: Diagnosis present

## 2023-02-11 DIAGNOSIS — G43909 Migraine, unspecified, not intractable, without status migrainosus: Secondary | ICD-10-CM | POA: Insufficient documentation

## 2023-02-11 DIAGNOSIS — F17210 Nicotine dependence, cigarettes, uncomplicated: Secondary | ICD-10-CM | POA: Diagnosis not present

## 2023-02-11 DIAGNOSIS — J449 Chronic obstructive pulmonary disease, unspecified: Secondary | ICD-10-CM | POA: Insufficient documentation

## 2023-02-11 DIAGNOSIS — G4733 Obstructive sleep apnea (adult) (pediatric): Secondary | ICD-10-CM | POA: Insufficient documentation

## 2023-02-11 DIAGNOSIS — K219 Gastro-esophageal reflux disease without esophagitis: Secondary | ICD-10-CM | POA: Diagnosis not present

## 2023-02-11 DIAGNOSIS — J432 Centrilobular emphysema: Secondary | ICD-10-CM | POA: Insufficient documentation

## 2023-02-11 DIAGNOSIS — I2510 Atherosclerotic heart disease of native coronary artery without angina pectoris: Secondary | ICD-10-CM | POA: Diagnosis not present

## 2023-02-11 HISTORY — PX: TOTAL HIP ARTHROPLASTY: SHX124

## 2023-02-11 SURGERY — ARTHROPLASTY, HIP, TOTAL, ANTERIOR APPROACH
Anesthesia: Spinal | Site: Hip | Laterality: Right

## 2023-02-11 MED ORDER — LACTATED RINGERS IV BOLUS
250.0000 mL | Freq: Once | INTRAVENOUS | Status: AC
  Administered 2023-02-11: 250 mL via INTRAVENOUS

## 2023-02-11 MED ORDER — BUPIVACAINE LIPOSOME 1.3 % IJ SUSP: 10.0000 mL | Freq: Once | INTRAMUSCULAR | Status: DC

## 2023-02-11 MED ORDER — MIDAZOLAM HCL 2 MG/2ML IJ SOLN
INTRAMUSCULAR | Status: AC
  Filled 2023-02-11: qty 2

## 2023-02-11 MED ORDER — BUPIVACAINE LIPOSOME 1.3 % IJ SUSP
INTRAMUSCULAR | Status: AC
  Filled 2023-02-11: qty 10

## 2023-02-11 MED ORDER — DEXAMETHASONE SODIUM PHOSPHATE 10 MG/ML IJ SOLN
INTRAMUSCULAR | Status: DC | PRN
Start: 2023-02-11 — End: 2023-02-11
  Administered 2023-02-11: 8 mg via INTRAVENOUS

## 2023-02-11 MED ORDER — ONDANSETRON HCL 4 MG/2ML IJ SOLN: 4.0000 mg | Freq: Four times a day (QID) | INTRAMUSCULAR | Status: DC | PRN

## 2023-02-11 MED ORDER — PHENYLEPHRINE 80 MCG/ML (10ML) SYRINGE FOR IV PUSH (FOR BLOOD PRESSURE SUPPORT)
PREFILLED_SYRINGE | INTRAVENOUS | Status: AC
  Filled 2023-02-11: qty 20

## 2023-02-11 MED ORDER — LACTATED RINGERS IV SOLN: INTRAVENOUS | Status: DC

## 2023-02-11 MED ORDER — ONDANSETRON HCL 4 MG PO TABS: 4.0000 mg | ORAL_TABLET | Freq: Four times a day (QID) | ORAL | Status: DC | PRN

## 2023-02-11 MED ORDER — LACTATED RINGERS IV BOLUS: 250.0000 mL | Freq: Once | INTRAVENOUS | Status: DC

## 2023-02-11 MED ORDER — ORAL CARE MOUTH RINSE: 15.0000 mL | Freq: Once | OROMUCOSAL | Status: AC

## 2023-02-11 MED ORDER — ASPIRIN 81 MG PO TBEC: 81.0000 mg | DELAYED_RELEASE_TABLET | Freq: Two times a day (BID) | ORAL | 0 refills | Status: AC

## 2023-02-11 MED ORDER — TRANEXAMIC ACID-NACL 1000-0.7 MG/100ML-% IV SOLN
1000.0000 mg | INTRAVENOUS | Status: AC
  Administered 2023-02-11: 1000 mg via INTRAVENOUS
  Filled 2023-02-11: qty 100

## 2023-02-11 MED ORDER — KETOROLAC TROMETHAMINE 15 MG/ML IJ SOLN
7.5000 mg | Freq: Four times a day (QID) | INTRAMUSCULAR | Status: DC
  Administered 2023-02-11: 7.5 mg via INTRAVENOUS

## 2023-02-11 MED ORDER — GLYCOPYRROLATE 0.2 MG/ML IJ SOLN
INTRAMUSCULAR | Status: DC | PRN
  Administered 2023-02-11: .2 mg via INTRAVENOUS

## 2023-02-11 MED ORDER — POVIDONE-IODINE 10 % EX SWAB: 2.0000 | Freq: Once | CUTANEOUS | Status: DC

## 2023-02-11 MED ORDER — TIZANIDINE HCL 4 MG PO TABS: 4.0000 mg | ORAL_TABLET | Freq: Four times a day (QID) | ORAL | 1 refills | Status: AC | PRN

## 2023-02-11 MED ORDER — METHOCARBAMOL 500 MG IVPB - SIMPLE MED
INTRAVENOUS | Status: AC
  Filled 2023-02-11: qty 55

## 2023-02-11 MED ORDER — 0.9 % SODIUM CHLORIDE (POUR BTL) OPTIME
TOPICAL | Status: DC | PRN
  Administered 2023-02-11: 1000 mL

## 2023-02-11 MED ORDER — TRANEXAMIC ACID-NACL 1000-0.7 MG/100ML-% IV SOLN: 1000.0000 mg | Freq: Once | INTRAVENOUS | Status: AC

## 2023-02-11 MED ORDER — HYDROCODONE-ACETAMINOPHEN 5-325 MG PO TABS: 1.0000 | ORAL_TABLET | ORAL | Status: DC | PRN

## 2023-02-11 MED ORDER — MIDAZOLAM HCL 5 MG/5ML IJ SOLN
INTRAMUSCULAR | Status: DC | PRN
  Administered 2023-02-11: 2 mg via INTRAVENOUS

## 2023-02-11 MED ORDER — PHENYLEPHRINE HCL (PRESSORS) 10 MG/ML IV SOLN
INTRAVENOUS | Status: AC
  Filled 2023-02-11: qty 1

## 2023-02-11 MED ORDER — TRANEXAMIC ACID 1000 MG/10ML IV SOLN
2000.0000 mg | INTRAVENOUS | Status: DC
  Filled 2023-02-11: qty 20

## 2023-02-11 MED ORDER — TRANEXAMIC ACID-NACL 1000-0.7 MG/100ML-% IV SOLN
INTRAVENOUS | Status: AC
  Administered 2023-02-11: 1000 mg via INTRAVENOUS
  Filled 2023-02-11: qty 100

## 2023-02-11 MED ORDER — BUPIVACAINE IN DEXTROSE 0.75-8.25 % IT SOLN
INTRATHECAL | Status: DC | PRN
  Administered 2023-02-11: 1.8 mL via INTRATHECAL

## 2023-02-11 MED ORDER — BUPIVACAINE-EPINEPHRINE (PF) 0.25% -1:200000 IJ SOLN: INTRAMUSCULAR | Status: DC | PRN

## 2023-02-11 MED ORDER — METOCLOPRAMIDE HCL 5 MG/ML IJ SOLN: 5.0000 mg | Freq: Three times a day (TID) | INTRAMUSCULAR | Status: DC | PRN

## 2023-02-11 MED ORDER — ONDANSETRON HCL 4 MG/2ML IJ SOLN
INTRAMUSCULAR | Status: DC | PRN
  Administered 2023-02-11: 4 mg via INTRAVENOUS

## 2023-02-11 MED ORDER — MORPHINE SULFATE (PF) 2 MG/ML IV SOLN
INTRAVENOUS | Status: AC
  Administered 2023-02-11: 1 mg via INTRAVENOUS
  Filled 2023-02-11: qty 1

## 2023-02-11 MED ORDER — FENTANYL CITRATE (PF) 250 MCG/5ML IJ SOLN
INTRAMUSCULAR | Status: DC | PRN
  Administered 2023-02-11: 50 ug via INTRAVENOUS

## 2023-02-11 MED ORDER — FENTANYL CITRATE (PF) 100 MCG/2ML IJ SOLN
INTRAMUSCULAR | Status: AC
  Filled 2023-02-11: qty 2

## 2023-02-11 MED ORDER — KETOROLAC TROMETHAMINE 15 MG/ML IJ SOLN
INTRAMUSCULAR | Status: AC
  Filled 2023-02-11: qty 1

## 2023-02-11 MED ORDER — CEFAZOLIN SODIUM-DEXTROSE 2-4 GM/100ML-% IV SOLN: 2.0000 g | Freq: Four times a day (QID) | INTRAVENOUS | Status: DC

## 2023-02-11 MED ORDER — METHOCARBAMOL 500 MG IVPB - SIMPLE MED
500.0000 mg | Freq: Four times a day (QID) | INTRAVENOUS | Status: DC | PRN
  Administered 2023-02-11: 500 mg via INTRAVENOUS

## 2023-02-11 MED ORDER — OXYCODONE HCL 5 MG/5ML PO SOLN: 5.0000 mg | Freq: Once | ORAL | Status: DC | PRN

## 2023-02-11 MED ORDER — ONDANSETRON HCL 4 MG/2ML IJ SOLN: 4.0000 mg | Freq: Once | INTRAMUSCULAR | Status: DC | PRN

## 2023-02-11 MED ORDER — STERILE WATER FOR IRRIGATION IR SOLN
Status: DC | PRN
  Administered 2023-02-11: 2000 mL

## 2023-02-11 MED ORDER — CEFAZOLIN SODIUM-DEXTROSE 2-4 GM/100ML-% IV SOLN
2.0000 g | INTRAVENOUS | Status: AC
  Administered 2023-02-11: 2 g via INTRAVENOUS
  Filled 2023-02-11: qty 100

## 2023-02-11 MED ORDER — HYDROCODONE-ACETAMINOPHEN 7.5-325 MG PO TABS
ORAL_TABLET | ORAL | Status: AC
  Filled 2023-02-11: qty 2

## 2023-02-11 MED ORDER — CEFAZOLIN SODIUM-DEXTROSE 1-4 GM/50ML-% IV SOLN
INTRAVENOUS | Status: DC | PRN
  Administered 2023-02-11: 1 g via INTRAVENOUS

## 2023-02-11 MED ORDER — HYDROCODONE-ACETAMINOPHEN 7.5-325 MG PO TABS
1.0000 | ORAL_TABLET | ORAL | Status: DC | PRN
  Administered 2023-02-11: 2 via ORAL

## 2023-02-11 MED ORDER — BUPIVACAINE-EPINEPHRINE 0.25% -1:200000 IJ SOLN
INTRAMUSCULAR | Status: AC
  Filled 2023-02-11: qty 1

## 2023-02-11 MED ORDER — BUPIVACAINE-EPINEPHRINE (PF) 0.25% -1:200000 IJ SOLN
INTRAMUSCULAR | Status: DC | PRN
  Administered 2023-02-11: 40 mL

## 2023-02-11 MED ORDER — TRANEXAMIC ACID 1000 MG/10ML IV SOLN
INTRAVENOUS | Status: DC | PRN
  Administered 2023-02-11: 2000 mg via TOPICAL

## 2023-02-11 MED ORDER — SODIUM CHLORIDE 0.9 % IR SOLN
Status: DC | PRN
  Administered 2023-02-11: 1000 mL

## 2023-02-11 MED ORDER — HYDROCODONE-ACETAMINOPHEN 5-325 MG PO TABS: 1.0000 | ORAL_TABLET | Freq: Four times a day (QID) | ORAL | 0 refills | Status: DC | PRN

## 2023-02-11 MED ORDER — ACETAMINOPHEN 10 MG/ML IV SOLN: 1000.0000 mg | Freq: Once | INTRAVENOUS | Status: DC | PRN

## 2023-02-11 MED ORDER — PROPOFOL 10 MG/ML IV BOLUS
INTRAVENOUS | Status: DC | PRN
  Administered 2023-02-11 (×3): 20 mg via INTRAVENOUS
  Administered 2023-02-11: 30 mg via INTRAVENOUS

## 2023-02-11 MED ORDER — FENTANYL CITRATE PF 50 MCG/ML IJ SOSY: 25.0000 ug | PREFILLED_SYRINGE | INTRAMUSCULAR | Status: DC | PRN

## 2023-02-11 MED ORDER — METOCLOPRAMIDE HCL 5 MG PO TABS: 5.0000 mg | ORAL_TABLET | Freq: Three times a day (TID) | ORAL | Status: DC | PRN

## 2023-02-11 MED ORDER — LACTATED RINGERS IV BOLUS: 500.0000 mL | Freq: Once | INTRAVENOUS | Status: DC

## 2023-02-11 MED ORDER — OXYCODONE HCL 5 MG PO TABS: 5.0000 mg | ORAL_TABLET | Freq: Once | ORAL | Status: DC | PRN

## 2023-02-11 MED ORDER — PHENYLEPHRINE HCL-NACL 20-0.9 MG/250ML-% IV SOLN
INTRAVENOUS | Status: DC | PRN
  Administered 2023-02-11: 30 ug/min via INTRAVENOUS

## 2023-02-11 MED ORDER — ACETAMINOPHEN 500 MG PO TABS: 500.0000 mg | ORAL_TABLET | Freq: Four times a day (QID) | ORAL | Status: DC

## 2023-02-11 MED ORDER — MORPHINE SULFATE (PF) 2 MG/ML IV SOLN: 0.5000 mg | INTRAVENOUS | Status: DC | PRN

## 2023-02-11 MED ORDER — PROPOFOL 1000 MG/100ML IV EMUL
INTRAVENOUS | Status: AC
  Filled 2023-02-11: qty 100

## 2023-02-11 MED ORDER — METHOCARBAMOL 500 MG PO TABS: 500.0000 mg | ORAL_TABLET | Freq: Four times a day (QID) | ORAL | Status: DC | PRN

## 2023-02-11 MED ORDER — EPHEDRINE SULFATE (PRESSORS) 50 MG/ML IJ SOLN
INTRAMUSCULAR | Status: DC | PRN
  Administered 2023-02-11: 5 mg via INTRAVENOUS

## 2023-02-11 MED ORDER — ACETAMINOPHEN 325 MG PO TABS: 325.0000 mg | ORAL_TABLET | Freq: Four times a day (QID) | ORAL | Status: DC | PRN

## 2023-02-11 MED ORDER — PROPOFOL 500 MG/50ML IV EMUL
INTRAVENOUS | Status: DC | PRN
  Administered 2023-02-11: 120 ug/kg/min via INTRAVENOUS

## 2023-02-11 MED ORDER — CHLORHEXIDINE GLUCONATE 0.12 % MT SOLN
15.0000 mL | Freq: Once | OROMUCOSAL | Status: AC
  Administered 2023-02-11: 15 mL via OROMUCOSAL

## 2023-02-11 SURGICAL SUPPLY — 50 items
BAG COUNTER SPONGE SURGICOUNT (BAG) IMPLANT
BAG DECANTER FOR FLEXI CONT (MISCELLANEOUS) ×1 IMPLANT
BAG SPNG CNTER NS LX DISP (BAG)
BLADE SAW SGTL 18X1.27X75 (BLADE) ×1 IMPLANT
BOOTIES KNEE HIGH SLOAN (MISCELLANEOUS) ×1 IMPLANT
CELLS DAT CNTRL 66122 CELL SVR (MISCELLANEOUS) ×1 IMPLANT
COVER PERINEAL POST (MISCELLANEOUS) ×1 IMPLANT
COVER SURGICAL LIGHT HANDLE (MISCELLANEOUS) ×1 IMPLANT
DRAPE FOOT SWITCH (DRAPES) ×1 IMPLANT
DRAPE IMP U-DRAPE 54X76 (DRAPES) ×1 IMPLANT
DRAPE STERI IOBAN 125X83 (DRAPES) ×1 IMPLANT
DRAPE U-SHAPE 47X51 STRL (DRAPES) ×1 IMPLANT
DRSG AQUACEL AG ADV 3.5X 6 (GAUZE/BANDAGES/DRESSINGS) ×1 IMPLANT
DURAPREP 26ML APPLICATOR (WOUND CARE) ×1 IMPLANT
ELECT BLADE TIP CTD 4 INCH (ELECTRODE) ×1 IMPLANT
ELECT REM PT RETURN 15FT ADLT (MISCELLANEOUS) ×1 IMPLANT
ELIMINATOR HOLE APEX DEPUY (Hips) IMPLANT
GLOVE BIO SURGEON STRL SZ8 (GLOVE) ×2 IMPLANT
GLOVE BIOGEL PI IND STRL 7.0 (GLOVE) ×1 IMPLANT
GLOVE BIOGEL PI IND STRL 8 (GLOVE) ×2 IMPLANT
GLOVE SURG SYN 7.0 (GLOVE) ×1 IMPLANT
GLOVE SURG SYN 7.0 PF PI (GLOVE) ×1 IMPLANT
GOWN SRG XL LVL 4 BRTHBL STRL (GOWNS) ×1 IMPLANT
GOWN STRL NON-REIN XL LVL4 (GOWNS) ×1
GOWN STRL REUS W/ TWL XL LVL3 (GOWN DISPOSABLE) ×2 IMPLANT
GOWN STRL REUS W/TWL XL LVL3 (GOWN DISPOSABLE) ×2
HEAD FEMORAL 32 CERAMIC (Hips) IMPLANT
HOLDER FOLEY CATH W/STRAP (MISCELLANEOUS) ×1 IMPLANT
KIT TURNOVER KIT A (KITS) IMPLANT
LINER ACET PNNCL PLUS4 NEUTRAL (Hips) IMPLANT
LINER PINN ACET GRIP 50X100 (Liner) IMPLANT
MANIFOLD NEPTUNE II (INSTRUMENTS) ×1 IMPLANT
NDL HYPO 22X1.5 SAFETY MO (MISCELLANEOUS) ×1 IMPLANT
NEEDLE HYPO 22X1.5 SAFETY MO (MISCELLANEOUS) ×1 IMPLANT
NS IRRIG 1000ML POUR BTL (IV SOLUTION) ×1 IMPLANT
PACK ANTERIOR HIP CUSTOM (KITS) ×1 IMPLANT
PINNACLE PLUS 4 NEUTRAL (Hips) ×1 IMPLANT
PROTECTOR NERVE ULNAR (MISCELLANEOUS) ×1 IMPLANT
RETRACTOR WND ALEXIS 18 MED (MISCELLANEOUS) ×1 IMPLANT
RTRCTR WOUND ALEXIS 18CM MED (MISCELLANEOUS) ×1
SPIKE FLUID TRANSFER (MISCELLANEOUS) ×1 IMPLANT
STEM FEMORAL SZ 6MM STD ACTIS (Stem) IMPLANT
SUT ETHIBOND NAB CT1 #1 30IN (SUTURE) ×2 IMPLANT
SUT VIC AB 1 CT1 36 (SUTURE) ×1 IMPLANT
SUT VIC AB 2-0 CT1 27 (SUTURE) ×1
SUT VIC AB 2-0 CT1 TAPERPNT 27 (SUTURE) ×1 IMPLANT
SUT VICRYL AB 3-0 FS1 BRD 27IN (SUTURE) ×1 IMPLANT
SUT VLOC 180 0 24IN GS25 (SUTURE) ×1 IMPLANT
SYR 50ML LL SCALE MARK (SYRINGE) ×1 IMPLANT
TRAY FOLEY MTR SLVR 16FR STAT (SET/KITS/TRAYS/PACK) ×1 IMPLANT

## 2023-02-11 NOTE — Op Note (Signed)
PRE-OP DIAGNOSIS:  RIGHT HIP DEGENERATIVE JOINT DISEASE POST-OP DIAGNOSIS:  same PROCEDURE: RIGHT TOTAL HIP ARTHROPLASTY ANTERIOR APPROACH ANESTHESIA:  Spinal and MAC SURGEON:  Marcene Corning MD ASSISTANT:  Elodia Florence PA-C   INDICATIONS FOR PROCEDURE:  The patient is a 57 y.o. female with a long history of a painful hip.  This has persisted despite multiple conservative measures.  The patient has persisted with pain and dysfunction making rest and activity difficult.  A total hip replacement is offered as surgical treatment.  Informed operative consent was obtained after discussion of possible complications including reaction to anesthesia, infection, neurovascular injury, dislocation, DVT, PE, and death.  The importance of the postoperative rehab program to optimize result was stressed with the patient.  SUMMARY OF FINDINGS AND PROCEDURE:  Under the above anesthesia through a anterior approach an the Hana table a right THR was performed.  The patient had severe degenerative change and good bone quality.  We used DePuy components to replace the hip and these were size 6 Actis femur capped with a +1 32mm ceramic hip ball.  On the acetabular side we used a size 50 Gription shell with a  plus 4 neutral polyethylene liner.  We did use a hole eliminator.  Elodia Florence PA-C assisted throughout and was invaluable to the completion of the case in that he helped position and retract while I performed the procedure.  He also closed simultaneously to help minimize OR time.  I used fluoroscopy throughout the case to check position of implants and leg lengths and read all of these views myself. The case was fairly difficult due to size of the patient but everything went very well.  DESCRIPTION OF PROCEDURE:  The patient was taken to the OR suite where the above anesthetic was applied.  The patient was then positioned on the Hana table supine.  All bony prominences were appropriately padded.  Prep and drape was then  performed in normal sterile fashion.  The patient was given kefzol preoperative antibiotic and an appropriate time out was performed.  We then took an anterior approach to the right hip.  Dissection was taken through adipose to the tensor fascia lata fascia.  This structure was incised longitudinally and we dissected in the intermuscular interval just medial to this muscle.  Cobra retractors were placed superior and inferior to the femoral neck superficial to the capsule.  A capsular incision was then made and the retractors were placed along the femoral neck.  Xray was brought in to get a good level for the femoral neck cut which was made with an oscillating saw and osteotome.  The femoral head was removed with a corkscrew.  The acetabulum was exposed and some labral tissues were excised. Reaming was taken to the inside wall of the pelvis and sequentially up to 1 mm smaller than the actual component.  A trial of components was done and then the aforementioned acetabular shell was placed in appropriate tilt and anteversion confirmed by fluoroscopy. The liner was placed along with the hole eliminator and attention was turned to the femur.  The leg was brought down and over into adduction and the elevator bar was used to raise the femur up gently in the wound.  The piriformis was released with care taken to preserve the obturator internus attachment and all of the posterior capsule. The femur was reamed and then broached to the appropriate size.  A trial reduction was done and the aforementioned head and neck assembly gave [place] the  best stability in extension with external rotation.  Leg lengths were felt to be about equal by fluoroscopic exam.  The trial components were removed and the wound irrigated.  We then placed the femoral component in appropriate anteversion.  The head was applied to a dry stem neck and the hip again reduced.  It was again stable in the aforementioned position.  The would was irrigated again  followed by re-approximation of anterior capsule with ethibond suture. Tensor fascia was repaired with V-loc suture  followed by deep closure with #O and #2 undyed vicryl.  Skin was closed with subQ stitch and steristrips followed by a sterile dressing.  EBL and IOF can be obtained from anesthesia records.  DISPOSITION:  The patient was taken to PACU in stable condition to potentially go home same day depending on ability to walk and tolerate liquids.

## 2023-02-11 NOTE — Anesthesia Procedure Notes (Signed)
Spinal  Patient location during procedure: OR Start time: 02/11/2023 12:40 PM End time: 02/11/2023 12:43 PM Reason for block: surgical anesthesia Staffing Performed: anesthesiologist  Anesthesiologist: Mariann Barter, MD Performed by: Mariann Barter, MD Authorized by: Mariann Barter, MD   Preanesthetic Checklist Completed: patient identified, IV checked, site marked, risks and benefits discussed, surgical consent, monitors and equipment checked, pre-op evaluation and timeout performed Spinal Block Patient position: sitting Prep: DuraPrep Patient monitoring: heart rate, cardiac monitor, continuous pulse ox and blood pressure Approach: midline Location: L4-5 Injection technique: single-shot Needle Needle type: Sprotte  Needle gauge: 25 G Needle length: 5 cm Assessment Events: CSF return

## 2023-02-11 NOTE — Evaluation (Signed)
Physical Therapy Evaluation Patient Details Name: CLESSIE PLANTS MRN: [id number] DOB: 17-Jun-1966 Today's Date: 02/11/2023  History of Present Illness  57 yo female presents to therapy s/p R THA, anterior approach on 02/11/2023 due to failure of conservative measures. Pt PMH includes but is not limited to: precordial pain, coronary artery calcification, GERD, OSA on CPAP, COPD, and cervical ca.  Clinical Impression    JADALEE DANNER is a 57 y.o. female POD 0 s/p R THA, AA. Patient reports IND with mobility at baseline. Patient is now limited by functional impairments (see PT problem list below) and requires CGA and cues for transfers and gait with RW. Patient was able to ambulate 60, 20 and 35 feet with RW and CGA and cues for safe walker management. Patient educated on safe sequencing for stair mobility, fall risk prevention, pain management and goal, cues of CP/ice, and car transfers pt and family member verbalized understanding of  safe guarding position for people assisting with mobility. Patient instructed in exercises to facilitate ROM and circulation reviewed and HO provided. Patient will benefit from continued skilled PT interventions to address impairments and progress towards PLOF. Patient has met mobility goals at adequate level for discharge home with family support and OPPT services; will continue to follow if pt continues acute stay to progress towards Mod I goals.        If plan is discharge home, recommend the following: A little help with walking and/or transfers;A little help with bathing/dressing/bathroom;Assistance with cooking/housework;Assist for transportation;Help with stairs or ramp for entrance   Can travel by private vehicle        Equipment Recommendations Rolling walker (2 wheels) (provided and adjusted at eval)  Recommendations for Other Services       Functional Status Assessment Patient has had a recent decline in their functional status and demonstrates the  ability to make significant improvements in function in a reasonable and predictable amount of time.     Precautions / Restrictions Precautions Precautions: Fall Restrictions Weight Bearing Restrictions: No      Mobility  Bed Mobility Overal bed mobility: Needs Assistance Bed Mobility: Supine to Sit     Supine to sit: Min guard     General bed mobility comments: min cues    Transfers Overall transfer level: Needs assistance Equipment used: Rolling walker (2 wheels) Transfers: Sit to/from Stand Sit to Stand: Min guard           General transfer comment: min cues for proper UE palcement with bed, recliner and commode transfers    Ambulation/Gait Ambulation/Gait assistance: Min guard Gait Distance (Feet): 60 Feet Assistive device: Rolling walker (2 wheels) Gait Pattern/deviations: Step-to pattern, Antalgic, Trunk flexed Gait velocity: decreased     General Gait Details: min cues for sequencing and safety with turns and retrograde stepping patterns  Stairs Stairs: Yes Stairs assistance: Min guard Stair Management: Two rails Number of Stairs: 2 General stair comments: cues for technique and sequencing  Wheelchair Mobility     Tilt Bed    Modified Rankin (Stroke Patients Only)       Balance Overall balance assessment: Needs assistance Sitting-balance support: Feet supported Sitting balance-Leahy Scale: Good     Standing balance support: Bilateral upper extremity supported, During functional activity, Reliant on assistive device for balance Standing balance-Leahy Scale: Fair Standing balance comment: static standing no UE support  Pertinent Vitals/Pain Pain Assessment Pain Assessment: 0-10 Pain Score: 5  Pain Location: R hip and leg Pain Descriptors / Indicators: Aching, Constant, Discomfort, Operative site guarding Pain Intervention(s): Limited activity within patient's tolerance, Monitored during session,  Premedicated before session, Patient requesting pain meds-RN notified, Ice applied    Home Living Family/patient expects to be discharged to:: Private residence Living Arrangements: Alone Available Help at Discharge: Family (daughter) Type of Home: House Home Access: Stairs to enter Entrance Stairs-Rails: Right;Left;None Secretary/administrator of Steps: 5   Home Layout: One level Home Equipment: Toilet riser;Cane - single point      Prior Function Prior Level of Function : Independent/Modified Independent;Driving;Working/employed             Mobility Comments: IND no AD all ADLs, self care tasks, IADLs       Hand Dominance        Extremity/Trunk Assessment        Lower Extremity Assessment Lower Extremity Assessment: RLE deficits/detail RLE Deficits / Details: ankle DF/PF 5/5 RLE Sensation: WNL (mild reidual abn sensation groin)    Cervical / Trunk Assessment Cervical / Trunk Assessment:  (wfl)  Communication   Communication: No difficulties  Cognition Arousal/Alertness: Awake/alert Behavior During Therapy: WFL for tasks assessed/performed Overall Cognitive Status: Within Functional Limits for tasks assessed                                          General Comments      Exercises Total Joint Exercises Ankle Circles/Pumps: AROM, Both, 20 reps Quad Sets: AROM, Right, 5 reps Gluteal Sets: AROM, Both, 5 reps Heel Slides: AROM, Right, 5 reps Hip ABduction/ADduction: AROM, Right, 5 reps, Standing Long Arc Quad: AROM, Right, 5 reps Knee Flexion: AROM, Right, 5 reps, Standing Standing Hip Extension: AROM, Right, 5 reps   Assessment/Plan    PT Assessment Patient needs continued PT services  PT Problem List Decreased strength;Decreased range of motion;Decreased activity tolerance;Decreased balance;Decreased mobility;Decreased coordination;Pain       PT Treatment Interventions DME instruction;Gait training;Stair training;Functional  mobility training;Therapeutic activities;Therapeutic exercise;Balance training;Neuromuscular re-education;Patient/family education;Modalities    PT Goals (Current goals can be found in the Care Plan section)  Acute Rehab PT Goals Patient Stated Goal: to be able to cut my toe nails and wash my feet PT Goal Formulation: With patient Time For Goal Achievement: 02/25/23 Potential to Achieve Goals: Good    Frequency 7X/week     Co-evaluation               AM-PAC PT "6 Clicks" Mobility  Outcome Measure Help needed turning from your back to your side while in a flat bed without using bedrails?: A Little Help needed moving from lying on your back to sitting on the side of a flat bed without using bedrails?: A Little Help needed moving to and from a bed to a chair (including a wheelchair)?: A Little Help needed standing up from a chair using your arms (e.g., wheelchair or bedside chair)?: A Little Help needed to walk in hospital room?: A Little Help needed climbing 3-5 steps with a railing? : A Little 6 Click Score: 18    End of Session Equipment Utilized During Treatment: Gait belt Activity Tolerance: Patient tolerated treatment well Patient left: in chair;with call bell/phone within reach;with family/visitor present Nurse Communication: Mobility status;Other (comment) (pt readiness for d/c from PT standpoint) PT Visit Diagnosis: Unsteadiness  on feet (R26.81);Other abnormalities of gait and mobility (R26.89);Muscle weakness (generalized) (M62.81);Difficulty in walking, not elsewhere classified (R26.2);Pain Pain - Right/Left: Right Pain - part of body: Leg;Hip    Time: 1610-9604 PT Time Calculation (min) (ACUTE ONLY): 47 min   Charges:   PT Evaluation $PT Eval Low Complexity: 1 Low PT Treatments $Gait Training: 8-22 mins $Therapeutic Exercise: 8-22 mins PT General Charges $$ ACUTE PT VISIT: 1 Visit         Johnny Bridge, PT Acute Rehab   Jacqualyn Posey 02/11/2023, 7:03  PM

## 2023-02-11 NOTE — Anesthesia Preprocedure Evaluation (Signed)
Anesthesia Evaluation  Patient identified by MRN, date of birth, ID band Patient awake    Reviewed: Allergy & Precautions, NPO status , Patient's Chart, lab work & pertinent test results, reviewed documented beta blocker date and time   History of Anesthesia Complications Negative for: history of anesthetic complications  Airway Mallampati: II  TM Distance: >3 FB Neck ROM: Full    Dental no notable dental hx.    Pulmonary sleep apnea , COPD, Current Smoker   breath sounds clear to auscultation       Cardiovascular (-) hypertension(-) angina + CAD  (-) Past MI and (-) CABG (-) dysrhythmias  Rhythm:Regular Rate:Normal     Neuro/Psych  Headaches, neg Seizures    GI/Hepatic ,GERD  ,,(+) neg Cirrhosis        Endo/Other    Renal/GU      Musculoskeletal  (+) Arthritis ,    Abdominal   Peds  Hematology   Anesthesia Other Findings   Reproductive/Obstetrics                              Anesthesia Physical Anesthesia Plan  ASA: 2  Anesthesia Plan: Spinal   Post-op Pain Management:    Induction: Intravenous  PONV Risk Score and Plan: 1 and Ondansetron and TIVA  Airway Management Planned:   Additional Equipment:   Intra-op Plan:   Post-operative Plan: Extubation in OR  Informed Consent:      Dental advisory given  Plan Discussed with: CRNA  Anesthesia Plan Comments:          Anesthesia Quick Evaluation

## 2023-02-11 NOTE — Transfer of Care (Signed)
Immediate Anesthesia Transfer of Care Note  Patient: Kathryn Mason  Procedure(s) Performed: RIGHT TOTAL HIP ARTHROPLASTY ANTERIOR APPROACH (Right: Hip)  Patient Location: PACU  Anesthesia Type:Spinal  Level of Consciousness: awake, alert , and oriented  Airway & Oxygen Therapy: Patient Spontanous Breathing  Post-op Assessment: Report given to RN and Post -op Vital signs reviewed and stable  Post vital signs: Reviewed and stable  Last Vitals:  Vitals Value Taken Time  BP 116/55 02/11/23 1429  Temp    Pulse 79 02/11/23 1429  Resp 18 02/11/23 1429  SpO2 100 % 02/11/23 1429  Vitals shown include unfiled device data.  Last Pain:  Vitals:   02/11/23 1429  TempSrc:   PainSc: 0-No pain         Complications: No notable events documented.

## 2023-02-11 NOTE — Interval H&P Note (Signed)
History and Physical Interval Note:  02/11/2023 11:46 AM  Kathryn Mason  has presented today for surgery, with the diagnosis of RIGHT HIP DEGENEARTIVE JOINT DISEASE.  The various methods of treatment have been discussed with the patient and family. After consideration of risks, benefits and other options for treatment, the patient has consented to  Procedure(s): RIGHT TOTAL HIP ARTHROPLASTY ANTERIOR APPROACH (Right) as a surgical intervention.  The patient's history has been reviewed, patient examined, no change in status, stable for surgery.  I have reviewed the patient's chart and labs.  Questions were answered to the patient's satisfaction.     Velna Ochs

## 2023-02-12 ENCOUNTER — Encounter (HOSPITAL_COMMUNITY): Payer: Self-pay | Admitting: Orthopaedic Surgery

## 2023-02-12 NOTE — Anesthesia Postprocedure Evaluation (Signed)
Anesthesia Post Note  Patient: Kathryn Mason  Procedure(s) Performed: RIGHT TOTAL HIP ARTHROPLASTY ANTERIOR APPROACH (Right: Hip)     Patient location during evaluation: PACU Anesthesia Type: Spinal Level of consciousness: awake, awake and alert and oriented Pain management: pain level controlled Vital Signs Assessment: post-procedure vital signs reviewed and stable Respiratory status: spontaneous breathing, nonlabored ventilation and respiratory function stable Cardiovascular status: blood pressure returned to baseline and stable Postop Assessment: no headache, no backache, spinal receding and no apparent nausea or vomiting Anesthetic complications: no   No notable events documented.  Last Vitals:  Vitals:   02/11/23 1530 02/11/23 1700  BP: (!) 117/45 (!) 144/70  Pulse: 71 70  Resp:    Temp: 36.6 C   SpO2: 98% 100%    Last Pain:  Vitals:   02/11/23 1830  TempSrc:   PainSc: 4                  Collene Schlichter

## 2023-02-19 ENCOUNTER — Telehealth (INDEPENDENT_AMBULATORY_CARE_PROVIDER_SITE_OTHER): Payer: 59 | Admitting: Nurse Practitioner

## 2023-02-19 ENCOUNTER — Encounter: Payer: Self-pay | Admitting: Nurse Practitioner

## 2023-02-19 VITALS — Ht 69.0 in | Wt 271.0 lb

## 2023-02-19 DIAGNOSIS — G4733 Obstructive sleep apnea (adult) (pediatric): Secondary | ICD-10-CM

## 2023-02-19 NOTE — Assessment & Plan Note (Signed)
Severe OSA on CPAP therapy. She has excellent control and compliance with this. Improvement with pressure settings; tolerating well. Receives benefit from use. Aware of proper use/care of device. Safe driving practices reviewed.   Patient Instructions  Continue to use CPAP every night, minimum of 4-6 hours a night.  Change equipment every 30 days or as directed by DME. Wash your tubing with warm soap and water daily, hang to dry. Wash humidifier portion weekly.  Be aware of reduced alertness and do not drive or operate heavy machinery if experiencing this or drowsiness.  Exercise encouraged, as tolerated. Notify if persistent daytime sleepiness occurs even with consistent use of CPAP.   Follow up in 6 months with Dr. Wynona Neat or Philis Nettle, or sooner, if needed

## 2023-02-19 NOTE — Patient Instructions (Signed)
Continue to use CPAP every night, minimum of 4-6 hours a night.  Change equipment every 30 days or as directed by DME. Wash your tubing with warm soap and water daily, hang to dry. Wash humidifier portion weekly.  Be aware of reduced alertness and do not drive or operate heavy machinery if experiencing this or drowsiness.  Exercise encouraged, as tolerated. Notify if persistent daytime sleepiness occurs even with consistent use of CPAP.   Follow up in 6 months with Dr. Wynona Neat or Philis Nettle, or sooner, if needed

## 2023-02-19 NOTE — Progress Notes (Signed)
Patient ID: Kathryn Mason, female     DOB: May 09, 1966, 57 y.o.      MRN: [id number]  Chief Complaint  Patient presents with   Follow-up    Pt is here for Sleep Apnea F/U visit.     Virtual Visit via Video Note  I connected with Kathryn Mason on 02/19/23 at  9:30 AM EDT by a video enabled telemedicine application and verified that I am speaking with the correct person using two identifiers.  Location: Patient: Home Provider: Office   I discussed the limitations of evaluation and management by telemedicine and the availability of in person appointments. The patient expressed understanding and agreed to proceed.  History of Present Illness: 57 year old female, active smoker followed for severe OSA on CPAP.  She was previously seen in our office for sleep apnea and followed by Dr. Shelle Iron then Dr. Vassie Loll.  Last seen in 2019 by Tanda Rockers, NP.  Past medical history significant for CAD, GERD, obesity, OSA on CPAP.   TEST/EVENTS:  12/2013 HST: AHI 49/h   05/02/2022: OV with Chantavia Bazzle NP for sleep consult and to reestablish care for management of her obstructive sleep apnea.  She is currently on CPAP therapy and has been since 2015.  She is wearing it every single night.  Feels like she cannot sleep without it.  Has good control of her symptoms.  No excessive daytime fatigue symptoms or snoring. No drowsy driving.  Wakes feeling rested. Goes to bed between 11 PM and 1 AM.  Falls asleep within 15 to 20 minutes.  Wakes usually once a night.  Officially gets out of the bed in the morning around 7:30 AM.  She had lost about 50 pounds but then gained it back.  No significant cardiac history no history of stroke. She is an active smoker.  Smokes 5 cigarettes a day.  Currently trying to quit.  Lives at home alone.  Widowed.  Works as an Print production planner.  Does not operate any heavy machinery in her job Animal nutritionist.  Family history of mother and father with heart disease and father with cancer. [facility] Orders for  new machine. Change settings to 8-18 cmH2O given average use of 15 cmH2O.   08/09/2022: OV with Crystallee Werden NP for follow-up after receiving new CPAP machine.  We also adjusted her pressures to 8-18 cmH2O.  She had gotten a nasal pillow mask, which she really liked but she felt like her pressures were too high with this.  Because of this, she has gone back to her old mask.  Not currently having any issues but would like to try the other mask again.  Feels like she sleeps well with her CPAP.  Wakes up feeling rested.  Does not have any significant daytime symptoms.  Denies any drowsy driving or morning headaches.  07/09/2022-08/07/2022 CPAP 8-18 cmH2O 30/30 days; 97% greater than 4 hours; average use 7 hours 38 minutes Pressure 95th 15.2 Leaks 95th 34.9 AHI 2.  02/19/2023: Today - follow up Patient presents today for follow up. She had right total hip surgery on 8/6. Surgery went well. She is recovering well but still having trouble with pain. From a sleep standpoint, feeling about the same. She wears her CPAP nightly. Rests well with it. Wakes up feeling refreshed. Does not have any issues with daytime drowsiness, drowsy driving or morning headaches. No trouble with leakage.   01/20/2023-02/18/2023: CPAP 8-15 cmH2O 27/30 days; 90% >4 hr; average use 7 hr 11 min Pressure 95th 14.7  Leaks 95th 7.1 AHI 1.8  No Known Allergies Immunization History  Administered Date(s) Administered   Tdap 10/11/2013   Past Medical History:  Diagnosis Date   Arthritis    Calcification of coronary artery    Cervical cancer (HCC) 07/09/2003   Status post hysterectomy   GERD (gastroesophageal reflux disease)    Headache    migraines   Hx of migraines    OSA and COPD overlap syndrome (HCC)    OSA on CPAP; CT scan centrilobular emphysema   OSA on CPAP    Tubal pregnancy     Tobacco History: Social History   Tobacco Use  Smoking Status Every Day   Current packs/day: 0.50   Average packs/day: 0.3 packs/day for  39.3 years (11.0 ttl pk-yrs)   Types: Cigarettes   Start date: 11/02/1983  Smokeless Tobacco Never  Tobacco Comments   Pt reports she is only smoking 5 cigarettes pd as of 08/09/2022 Almetta Lovely, CMA   Ready to quit: Not Answered Counseling given: Not Answered Tobacco comments: Pt reports she is only smoking 5 cigarettes pd as of 08/09/2022 Almetta Lovely, CMA   Outpatient Medications Prior to Visit  Medication Sig Dispense Refill   acetaminophen (TYLENOL) 500 MG tablet Take 500 mg by mouth every 6 (six) hours as needed.     aspirin EC 81 MG tablet Take 1 tablet (81 mg total) by mouth 2 (two) times daily after a meal. For 2 weeks then once a day for 2 weeks for DVT prevention. 45 tablet 0   calcium carbonate (TUMS - DOSED IN MG ELEMENTAL CALCIUM) 500 MG chewable tablet Chew 1 tablet by mouth daily as needed for indigestion or heartburn.     cycloSPORINE (RESTASIS) 0.05 % ophthalmic emulsion Place 1 drop into both eyes 2 (two) times daily as needed (dry eyes).     Magnesium 200 MG TABS Take 400 mg by mouth daily after breakfast.     Multiple Vitamin (MULTIVITAMIN) capsule Take 1 capsule by mouth daily.     OMEGA 3-6-9 FATTY ACIDS PO Take 1 capsule by mouth daily.     oxyCODONE-acetaminophen (PERCOCET/ROXICET) 5-325 MG tablet Take 1-2 tablets by mouth every 6 (six) hours as needed.     tiZANidine (ZANAFLEX) 4 MG tablet Take 1 tablet (4 mg total) by mouth every 6 (six) hours as needed for muscle spasms. 40 tablet 1   HYDROcodone-acetaminophen (NORCO/VICODIN) 5-325 MG tablet Take 1-2 tablets by mouth every 6 (six) hours as needed for moderate pain or severe pain (post op pain. max 6 per day.). 30 tablet 0   No facility-administered medications prior to visit.     Review of Systems:   Constitutional: No weight loss or gain, night sweats, fevers, chills, fatigue, or lassitude. HEENT: No headaches, difficulty swallowing, tooth/dental problems, or sore throat. No sneezing, itching, ear  ache, nasal congestion, or post nasal drip CV:  No chest pain, orthopnea, PND, swelling in lower extremities, anasarca, dizziness, palpitations, syncope Resp: No shortness of breath with exertion or at rest. No excess mucus or change in color of mucus. No productive or non-productive. No hemoptysis. No wheezing.  No chest wall deformity GI:  No heartburn, indigestion Skin: No rash, lesions, ulcerations MSK:  +hip pain Neuro: No dizziness or lightheadedness.  Psych: No depression or anxiety. Mood stable.   Observations/Objective: Patient is well-developed, well-nourished in no acute distress. A&Ox3. Resting comfortably at home. Unlabored breathing. Speech is clear and coherent with logical content.   Assessment and Plan: OSA (  obstructive sleep apnea) Severe OSA on CPAP therapy. She has excellent control and compliance with this. Improvement with pressure settings; tolerating well. Receives benefit from use. Aware of proper use/care of device. Safe driving practices reviewed.   Patient Instructions  Continue to use CPAP every night, minimum of 4-6 hours a night.  Change equipment every 30 days or as directed by DME. Wash your tubing with warm soap and water daily, hang to dry. Wash humidifier portion weekly.  Be aware of reduced alertness and do not drive or operate heavy machinery if experiencing this or drowsiness.  Exercise encouraged, as tolerated. Notify if persistent daytime sleepiness occurs even with consistent use of CPAP.   Follow up in 6 months with Dr. Wynona Neat or Philis Nettle, or sooner, if needed     I discussed the assessment and treatment plan with the patient. The patient was provided an opportunity to ask questions and all were answered. The patient agreed with the plan and demonstrated an understanding of the instructions.   The patient was advised to call back or seek an in-person evaluation if the symptoms worsen or if the condition fails to improve as anticipated.  I  provided 21 minutes of non-face-to-face time during this encounter.   Noemi Chapel, NP

## 2023-02-21 ENCOUNTER — Ambulatory Visit: Payer: 59 | Admitting: Nurse Practitioner

## 2023-06-20 ENCOUNTER — Telehealth: Payer: 59 | Admitting: Nurse Practitioner

## 2023-06-20 DIAGNOSIS — J0110 Acute frontal sinusitis, unspecified: Secondary | ICD-10-CM | POA: Diagnosis not present

## 2023-06-20 MED ORDER — FLUTICASONE PROPIONATE 50 MCG/ACT NA SUSP
2.0000 | Freq: Every day | NASAL | 6 refills | Status: AC
Start: 2023-06-20 — End: ?

## 2023-06-20 MED ORDER — AMOXICILLIN-POT CLAVULANATE 875-125 MG PO TABS
1.0000 | ORAL_TABLET | Freq: Two times a day (BID) | ORAL | 0 refills | Status: AC
Start: 2023-06-20 — End: 2023-06-27

## 2023-06-20 NOTE — Progress Notes (Signed)
E-Visit for Sinus Problems  We are sorry that you are not feeling well.  Here is how we plan to help!  Based on what you have shared with me it looks like you have sinusitis.  Sinusitis is inflammation and infection in the sinus cavities of the head.  Based on your presentation I believe you most likely have Acute Bacterial Sinusitis.  This is an infection caused by bacteria and is treated with antibiotics. I have prescribed Augmentin 875mg /125mg  one tablet twice daily with food, for 7 days. You may use an oral decongestant such as Mucinex D or if you have glaucoma or high blood pressure use plain Mucinex. Saline nasal spray help and can safely be used as often as needed for congestion.  If you develop worsening sinus pain, fever or notice severe headache and vision changes, or if symptoms are not better after completion of antibiotic, please schedule an appointment with a health care provider.   We have also prescribed a nasal spray to help with symptoms   Meds ordered this encounter  Medications   amoxicillin-clavulanate (AUGMENTIN) 875-125 MG tablet    Sig: Take 1 tablet by mouth 2 (two) times daily for 7 days. Take with food    Dispense:  14 tablet    Refill:  0   fluticasone (FLONASE) 50 MCG/ACT nasal spray    Sig: Place 2 sprays into both nostrils daily.    Dispense:  16 g    Refill:  6     Sinus infections are not as easily transmitted as other respiratory infection, however we still recommend that you avoid close contact with loved ones, especially the very young and elderly.  Remember to wash your hands thoroughly throughout the day as this is the number one way to prevent the spread of infection!  Home Care: Only take medications as instructed by your medical team. Complete the entire course of an antibiotic. Do not take these medications with alcohol. A steam or ultrasonic humidifier can help congestion.  You can place a towel over your head and breathe in the steam from hot water  coming from a faucet. Avoid close contacts especially the very young and the elderly. Cover your mouth when you cough or sneeze. Always remember to wash your hands.  Get Help Right Away If: You develop worsening fever or sinus pain. You develop a severe head ache or visual changes. Your symptoms persist after you have completed your treatment plan.  Make sure you Understand these instructions. Will watch your condition. Will get help right away if you are not doing well or get worse.  Thank you for choosing an e-visit.  Your e-visit answers were reviewed by a board certified advanced clinical practitioner to complete your personal care plan. Depending upon the condition, your plan could have included both over the counter or prescription medications.  Please review your pharmacy choice. Make sure the pharmacy is open so you can pick up prescription now. If there is a problem, you may contact your provider through [employer] and have the prescription routed to another pharmacy.  Your safety is important to [place]. If you have drug allergies check your prescription carefully.   For the next 24 hours you can use MyChart to ask questions about today's visit, request a non-urgent call back, or ask for a work or school excuse. You will get an email in the next two days asking about your experience. I hope that your e-visit has been valuable and will speed  your recovery.   I spent approximately 5 minutes reviewing the patient's history, current symptoms and coordinating their care today.

## 2023-07-01 ENCOUNTER — Telehealth: Payer: 59 | Admitting: Physician Assistant

## 2023-07-01 DIAGNOSIS — R3989 Other symptoms and signs involving the genitourinary system: Secondary | ICD-10-CM

## 2023-07-01 MED ORDER — NITROFURANTOIN MONOHYD MACRO 100 MG PO CAPS
100.0000 mg | ORAL_CAPSULE | Freq: Two times a day (BID) | ORAL | 0 refills | Status: AC
Start: 2023-07-01 — End: ?

## 2023-07-01 NOTE — Progress Notes (Signed)

## 2024-01-20 IMAGING — CR DG HIP (WITH OR WITHOUT PELVIS) 2-3V*R*
2 series · 2 of 2 positions shown · non-contrast
Comparison: None.

CLINICAL DATA: Right hip pain after fall 2 weeks ago.

EXAM:
DG HIP (WITH OR WITHOUT PELVIS) 2-3V RIGHT

[t hip ap right]
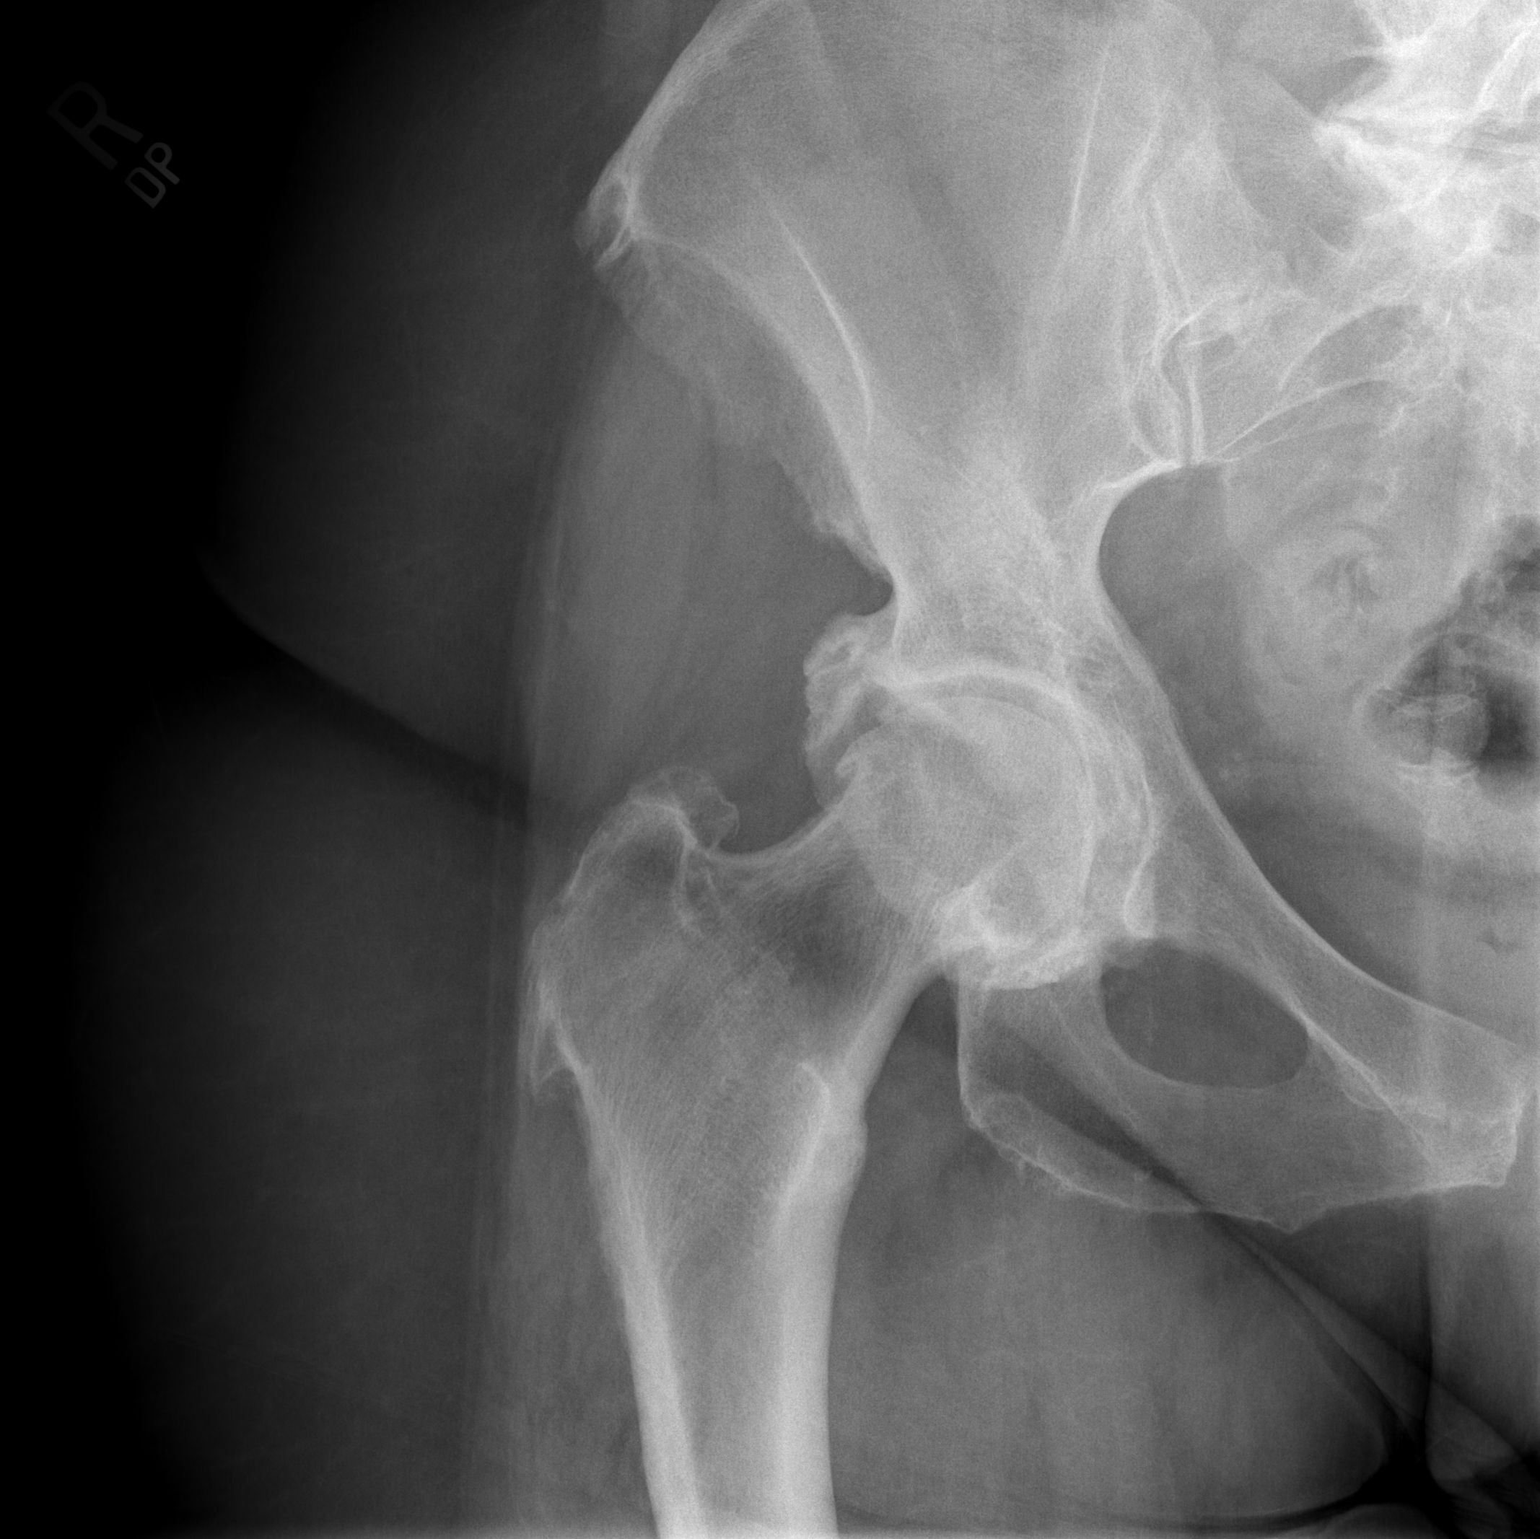

[t hip frog leg right]
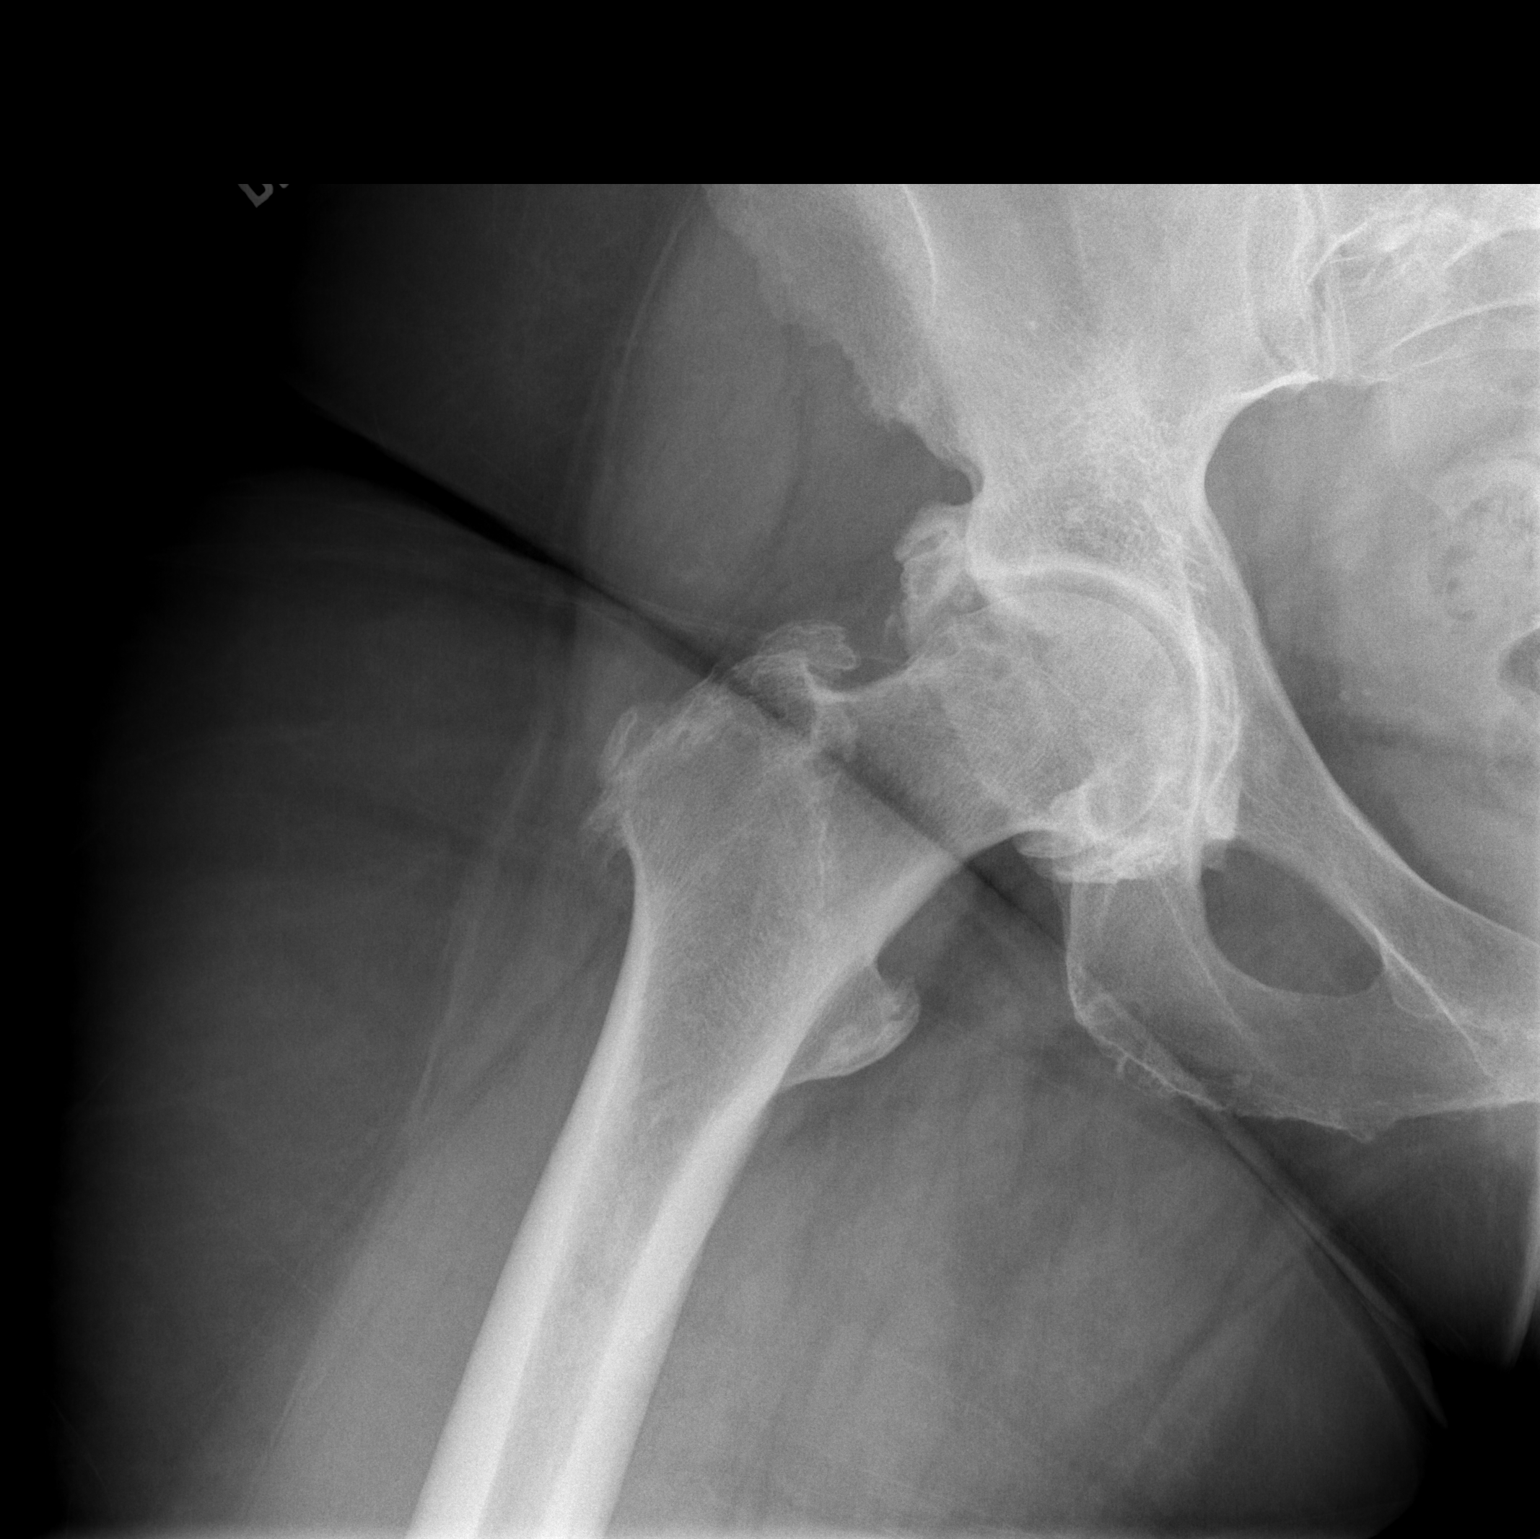

[2 of 2 positions shown; findings below may reference images not displayed]

FINDINGS: There is no evidence of hip fracture or dislocation. Severe
osteophyte formation is seen involving the right hip joint.
IMPRESSION: Severe degenerative joint disease of right hip. No acute abnormality
seen.

## 2024-01-20 IMAGING — MG MM DIGITAL SCREENING BILAT W/ TOMO AND CAD
8 series · 9 of 24 positions shown · non-contrast
Comparison: Previous exam(s).

CLINICAL DATA: Screening.

EXAM:
DIGITAL SCREENING BILATERAL MAMMOGRAM WITH TOMOSYNTHESIS AND CAD
TECHNIQUE: Bilateral screening digital craniocaudal and mediolateral oblique
mammograms were obtained. Bilateral screening digital breast
tomosynthesis was performed. The images were evaluated with
computer-aided detection.

[L CC synth-2D]
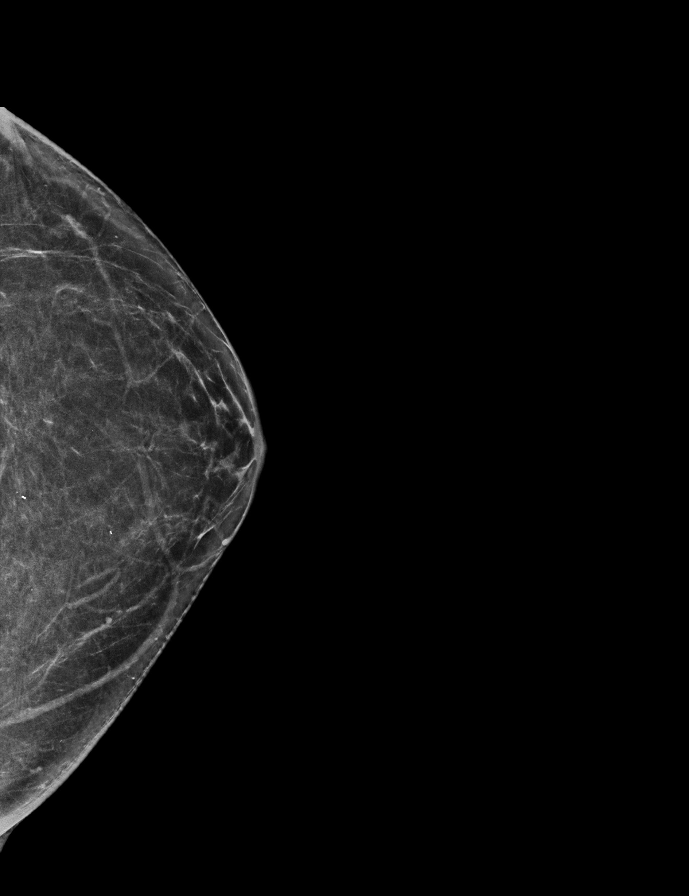

[R MLO synth-2D]
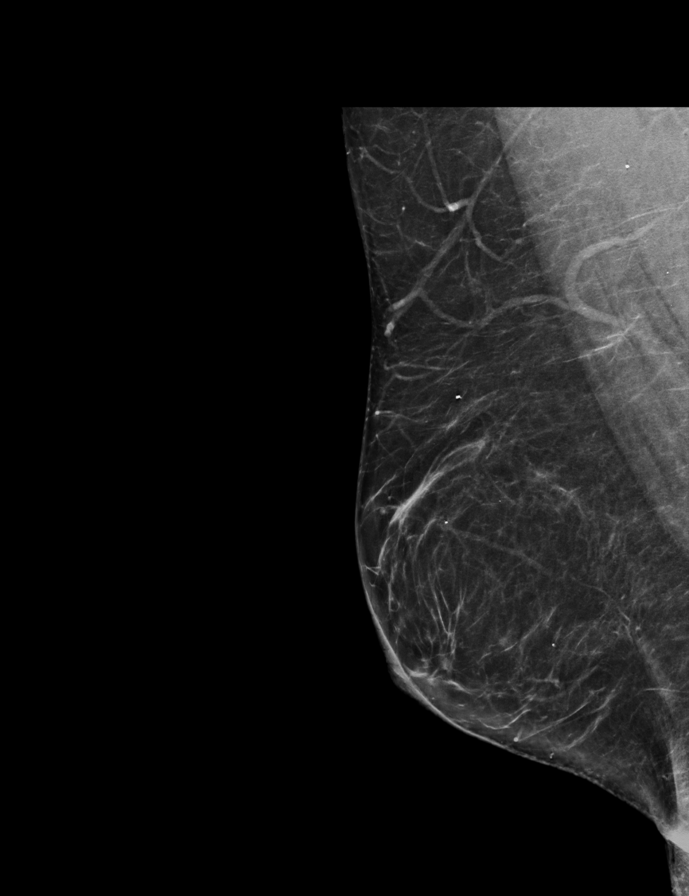

[R CC synth-2D]
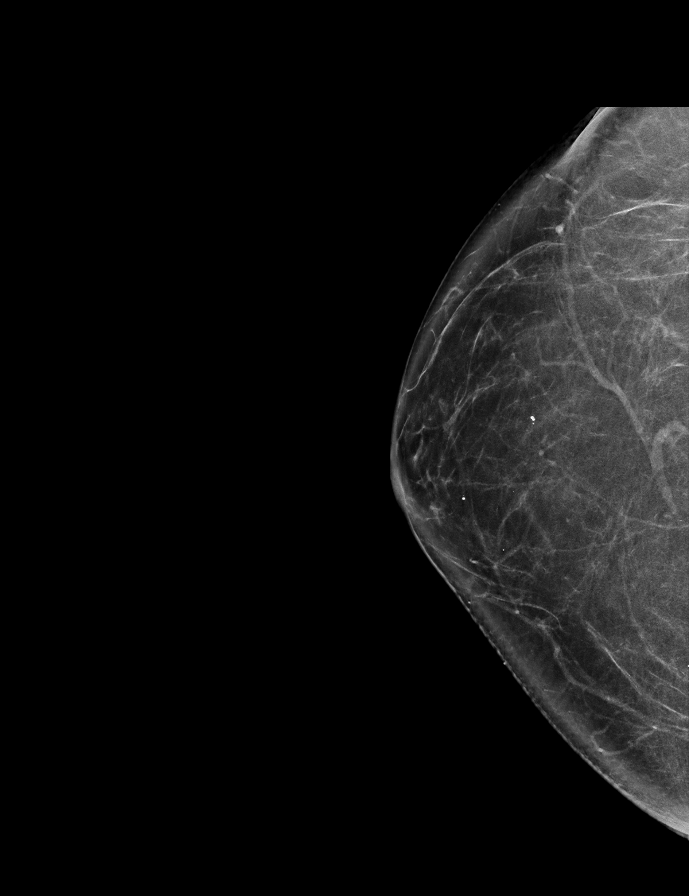

[L MLO synth-2D]
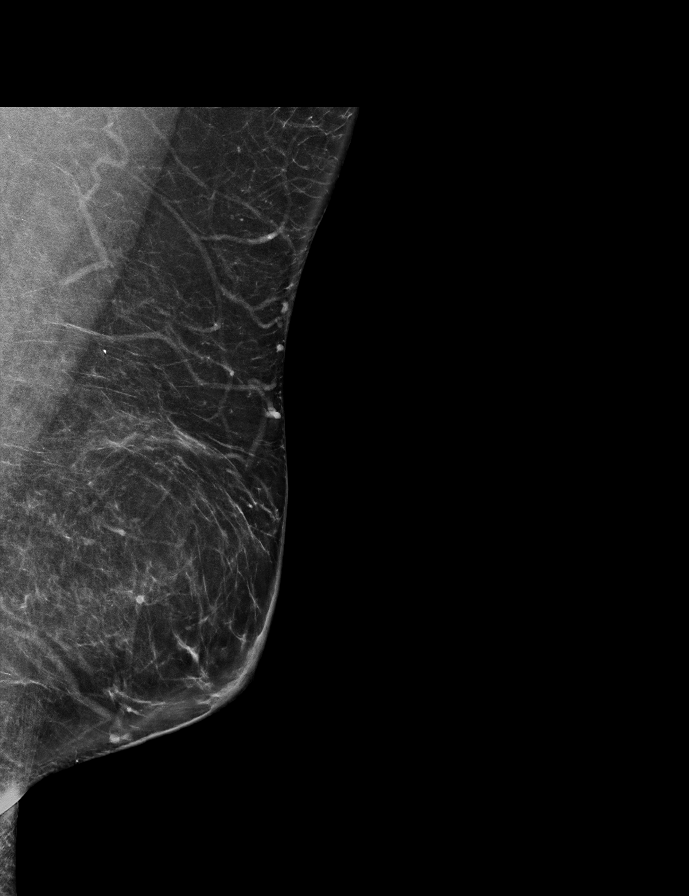

[L CC tomo · 2 of 58 frames shown]
[frame 19/58]
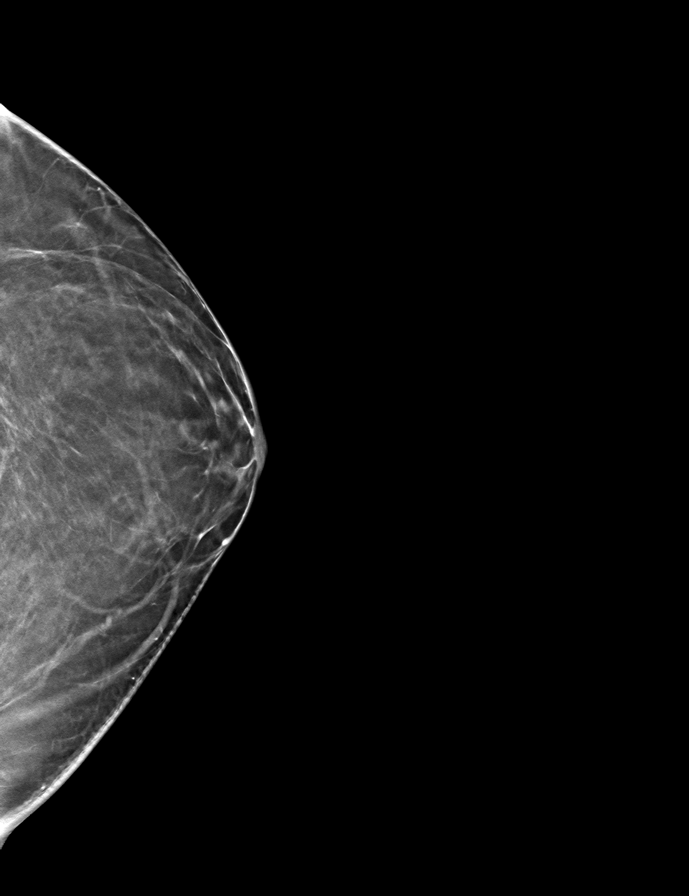
[frame 29/58]
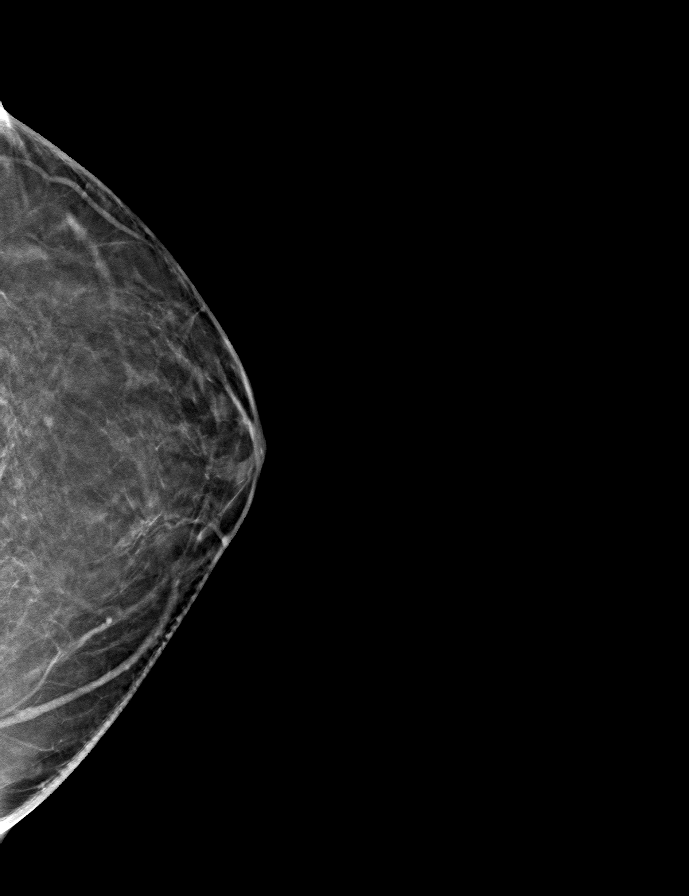

[R CC tomo · tomo slice 35/68.0]
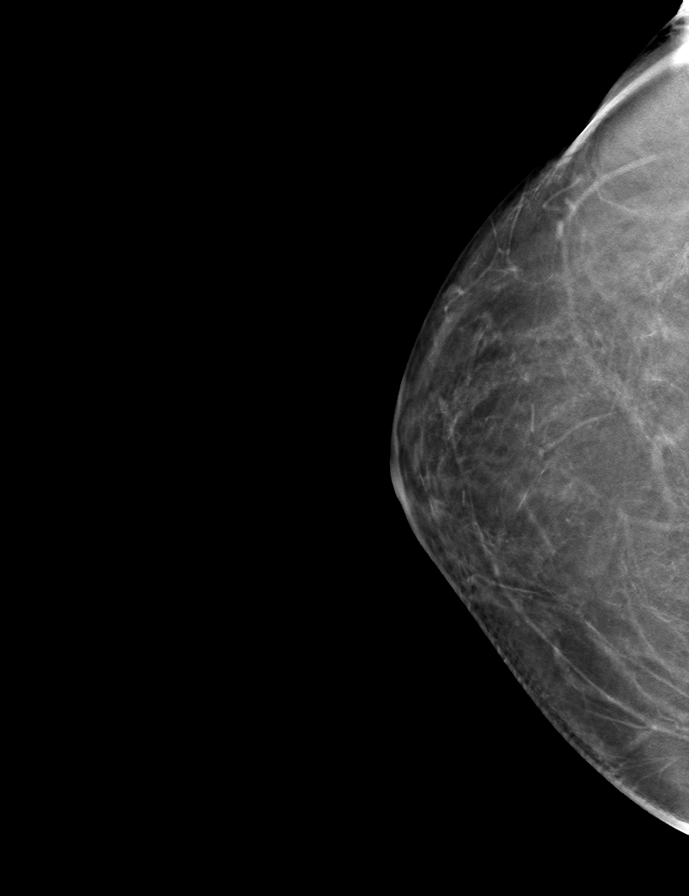

[R MLO tomo · tomo slice 37/72.0]
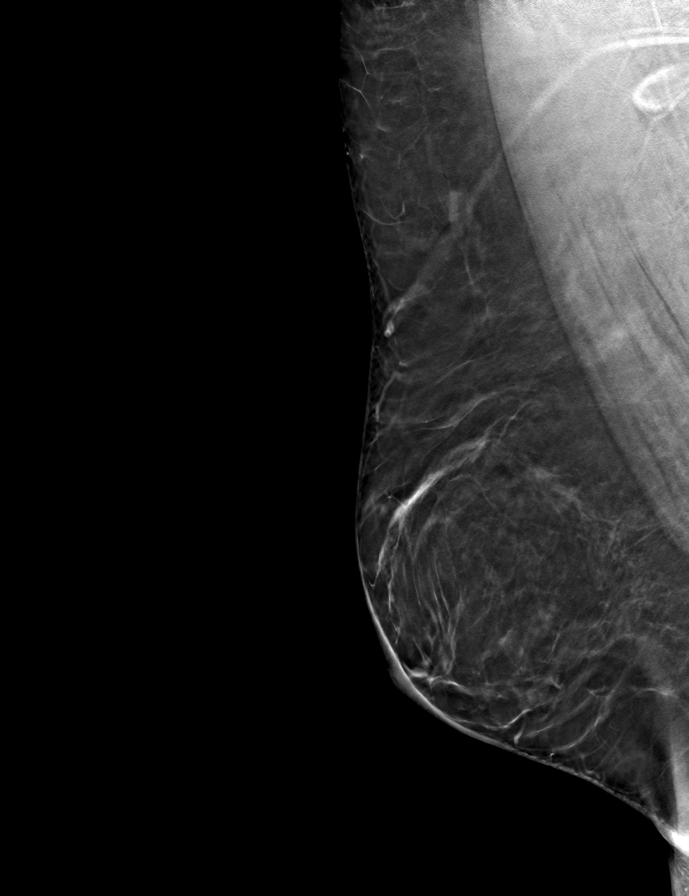

[L MLO tomo · tomo slice 35/69.0]
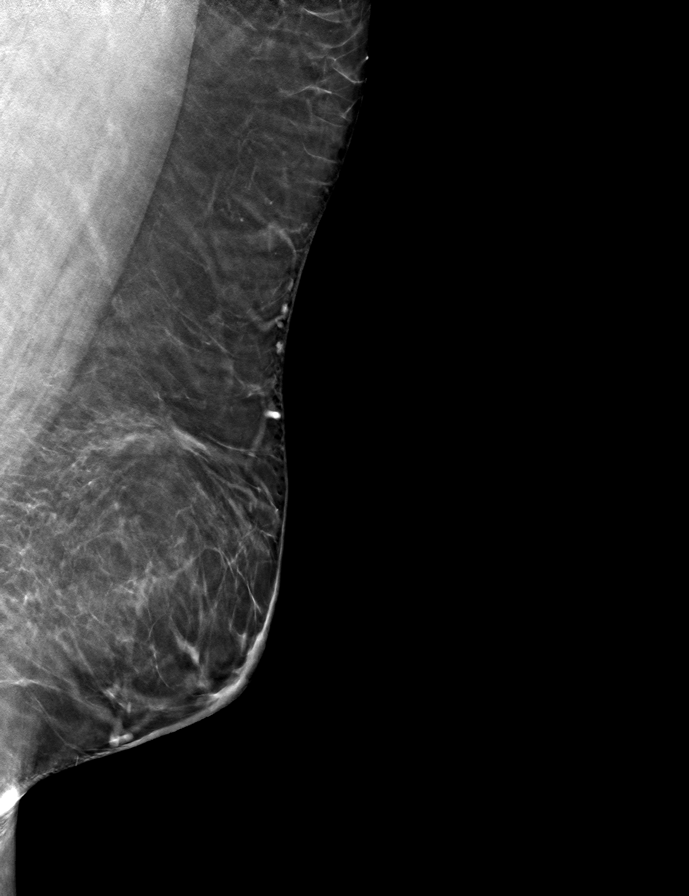

[9 of 24 positions shown; findings below may reference images not displayed]

ACR Breast Density Category b: There are scattered areas of
fibroglandular density.
FINDINGS: There are no findings suspicious for malignancy.
IMPRESSION: No mammographic evidence of malignancy. A result letter of this
screening mammogram will be mailed directly to the patient.

RECOMMENDATION:
Screening mammogram in one year. (Code:51-O-LD2)

BI-RADS CATEGORY  1: Negative.
